# Patient Record
Sex: Male | Born: 1975 | Race: White | Hispanic: No | Marital: Married | Smoking: Never smoker
Health system: Southern US, Community
[De-identification: ages and names within clinical notes are randomized; demographics above are authoritative.]

## PROBLEM LIST (undated history)

## (undated) DIAGNOSIS — N182 Chronic kidney disease, stage 2 (mild): Secondary | ICD-10-CM

## (undated) DIAGNOSIS — R569 Unspecified convulsions: Secondary | ICD-10-CM

## (undated) DIAGNOSIS — K219 Gastro-esophageal reflux disease without esophagitis: Secondary | ICD-10-CM

## (undated) DIAGNOSIS — T7840XA Allergy, unspecified, initial encounter: Secondary | ICD-10-CM

## (undated) DIAGNOSIS — I10 Essential (primary) hypertension: Secondary | ICD-10-CM

## (undated) DIAGNOSIS — G4730 Sleep apnea, unspecified: Secondary | ICD-10-CM

## (undated) HISTORY — DX: Unspecified convulsions: R56.9

## (undated) HISTORY — PX: WISDOM TOOTH EXTRACTION: SHX21

## (undated) HISTORY — DX: Chronic kidney disease, stage 2 (mild): N18.2

## (undated) HISTORY — DX: Allergy, unspecified, initial encounter: T78.40XA

## (undated) HISTORY — DX: Essential (primary) hypertension: I10

## (undated) HISTORY — DX: Sleep apnea, unspecified: G47.30

## (undated) HISTORY — DX: Gastro-esophageal reflux disease without esophagitis: K21.9

## (undated) HISTORY — PX: RHINOPLASTY: SUR1284

---

## 1999-06-10 ENCOUNTER — Emergency Department (HOSPITAL_COMMUNITY): Admission: EM | Admit: 1999-06-10 | Discharge: 1999-06-11 | Payer: Self-pay | Admitting: Internal Medicine

## 1999-06-10 ENCOUNTER — Encounter: Payer: Self-pay | Admitting: Internal Medicine

## 1999-06-20 ENCOUNTER — Ambulatory Visit (HOSPITAL_BASED_OUTPATIENT_CLINIC_OR_DEPARTMENT_OTHER): Admission: RE | Admit: 1999-06-20 | Discharge: 1999-06-20 | Payer: Self-pay | Admitting: *Deleted

## 2018-11-13 ENCOUNTER — Ambulatory Visit: Payer: Self-pay | Admitting: Family

## 2018-11-13 ENCOUNTER — Encounter

## 2018-11-27 ENCOUNTER — Ambulatory Visit: Payer: PRIVATE HEALTH INSURANCE | Admitting: Family

## 2018-11-27 ENCOUNTER — Encounter: Payer: Self-pay | Admitting: Family

## 2018-11-27 ENCOUNTER — Other Ambulatory Visit (INDEPENDENT_AMBULATORY_CARE_PROVIDER_SITE_OTHER): Payer: PRIVATE HEALTH INSURANCE

## 2018-11-27 ENCOUNTER — Encounter (INDEPENDENT_AMBULATORY_CARE_PROVIDER_SITE_OTHER): Payer: Self-pay

## 2018-11-27 VITALS — BP 122/78 | HR 78 | Temp 98.2°F | Ht 76.25 in | Wt 226.0 lb

## 2018-11-27 DIAGNOSIS — R0683 Snoring: Secondary | ICD-10-CM

## 2018-11-27 DIAGNOSIS — J302 Other seasonal allergic rhinitis: Secondary | ICD-10-CM

## 2018-11-27 DIAGNOSIS — Z125 Encounter for screening for malignant neoplasm of prostate: Secondary | ICD-10-CM

## 2018-11-27 DIAGNOSIS — Z0000 Encounter for general adult medical examination without abnormal findings: Secondary | ICD-10-CM

## 2018-11-27 DIAGNOSIS — Z13220 Encounter for screening for lipoid disorders: Secondary | ICD-10-CM | POA: Diagnosis not present

## 2018-11-27 DIAGNOSIS — E559 Vitamin D deficiency, unspecified: Secondary | ICD-10-CM

## 2018-11-27 DIAGNOSIS — J309 Allergic rhinitis, unspecified: Secondary | ICD-10-CM | POA: Insufficient documentation

## 2018-11-27 LAB — CBC WITH DIFFERENTIAL/PLATELET
Basophils Absolute: 0.1 10*3/uL (ref 0.0–0.1)
Basophils Relative: 0.9 % (ref 0.0–3.0)
Eosinophils Absolute: 0.3 10*3/uL (ref 0.0–0.7)
Eosinophils Relative: 3.4 % (ref 0.0–5.0)
HCT: 44.9 % (ref 39.0–52.0)
Hemoglobin: 14.5 g/dL (ref 13.0–17.0)
Lymphocytes Relative: 32.7 % (ref 12.0–46.0)
Lymphs Abs: 2.5 10*3/uL (ref 0.7–4.0)
MCHC: 32.2 g/dL (ref 30.0–36.0)
MCV: 77 fl — ABNORMAL LOW (ref 78.0–100.0)
Monocytes Absolute: 0.8 10*3/uL (ref 0.1–1.0)
Monocytes Relative: 11 % (ref 3.0–12.0)
Neutro Abs: 4 10*3/uL (ref 1.4–7.7)
Neutrophils Relative %: 52 % (ref 43.0–77.0)
Platelets: 202 10*3/uL (ref 150.0–400.0)
RBC: 5.83 Mil/uL — ABNORMAL HIGH (ref 4.22–5.81)
RDW: 18.4 % — ABNORMAL HIGH (ref 11.5–15.5)
WBC: 7.6 10*3/uL (ref 4.0–10.5)

## 2018-11-27 LAB — COMPREHENSIVE METABOLIC PANEL
ALT: 28 U/L (ref 0–53)
AST: 21 U/L (ref 0–37)
Albumin: 4.6 g/dL (ref 3.5–5.2)
Alkaline Phosphatase: 52 U/L (ref 39–117)
BILIRUBIN TOTAL: 0.4 mg/dL (ref 0.2–1.2)
BUN: 12 mg/dL (ref 6–23)
CO2: 26 mEq/L (ref 19–32)
Calcium: 9.6 mg/dL (ref 8.4–10.5)
Chloride: 102 mEq/L (ref 96–112)
Creatinine, Ser: 1.09 mg/dL (ref 0.40–1.50)
GFR: 73.83 mL/min (ref >60.00)
Glucose, Bld: 97 mg/dL (ref 70–99)
Potassium: 4.5 mEq/L (ref 3.5–5.1)
Sodium: 140 mEq/L (ref 135–145)
Total Protein: 7.5 g/dL (ref 6.0–8.3)

## 2018-11-27 LAB — LIPID PANEL
Cholesterol: 242 mg/dL — ABNORMAL HIGH (ref 0–200)
HDL: 47.1 mg/dL (ref >39.00)
LDL Cholesterol: 156 mg/dL — ABNORMAL HIGH (ref 0–99)
NonHDL: 195.08
Total CHOL/HDL Ratio: 5
Triglycerides: 194 mg/dL — ABNORMAL HIGH (ref 0.0–149.0)
VLDL: 38.8 mg/dL (ref 0.0–40.0)

## 2018-11-27 LAB — PSA: PSA: 1.17 ng/mL (ref 0.10–4.00)

## 2018-11-27 LAB — TSH: TSH: 1.7 u[IU]/mL (ref 0.35–4.50)

## 2018-11-27 LAB — VITAMIN D 25 HYDROXY (VIT D DEFICIENCY, FRACTURES): VITD: 27.74 ng/mL — ABNORMAL LOW (ref 30.00–100.00)

## 2018-11-27 NOTE — Patient Instructions (Signed)

## 2018-11-27 NOTE — Progress Notes (Signed)
Mark Price is a 43 y.o. male with the following history as recorded in Epic[employer]:  There are no active problems to display for this patient.   Current Outpatient Medications  Medication Sig Dispense Refill  . loratadine (CLARITIN) 10 MG tablet Take 10 mg by mouth as needed for allergies.     No current facility-administered medications for this visit.     Allergies: Patient has no allergy information on record.  Past Medical History:  Diagnosis Date  . Allergy   . GERD (gastroesophageal reflux disease)   . Seizures ([facility])     Past Surgical History:  Procedure Laterality Date  . RHINOPLASTY      Family History  Problem Relation Age of Onset  . COPD Mother   . Depression Mother   . Stroke Father   . Pulmonary fibrosis Father   . Heart attack Maternal Grandmother     Social History   Tobacco Use  . Smoking status: Never Smoker  . Smokeless tobacco: Never Used  Substance Use Topics  . Alcohol use: Yes    Subjective:  Patient presents today as a new patient; requesting CPE; in baseline state of health today;  Sees dentist regularly; does not exercise regularly; 2 children- 76 yo [place]ton, 3 yo Leonides Sake  Review of Systems  Constitutional: Negative.   HENT: Negative.   Eyes: Negative.   Respiratory: Negative for cough and shortness of breath.   Cardiovascular: Negative for chest pain.  Gastrointestinal: Negative for constipation, diarrhea and heartburn.  Genitourinary: Negative.   Musculoskeletal: Negative for joint pain and neck pain.  Skin: Negative.   Neurological: Positive for seizures. Negative for dizziness and headaches.       Childhood seizures- last took medication at age 28  Psychiatric/Behavioral: Negative for depression. The patient does not have insomnia.      Objective:  Vitals:   11/27/18 1110  BP: 122/78  Pulse: 78  Temp: 98.2 F (36.8 C)  TempSrc: Oral  SpO2: 95%  Weight: 226 lb (102.5 kg)  Height: 6' 4.25" (1.937 m)    General: Well  developed, well nourished, in no acute distress  Skin : Warm and dry.  Head: Normocephalic and atraumatic  Eyes: Sclera and conjunctiva clear; pupils round and reactive to light; extraocular movements intact  Ears: External normal; canals clear; tympanic membranes normal  Oropharynx: Pink, supple. No suspicious lesions  Neck: Supple without thyromegaly, adenopathy  Lungs: Respirations unlabored; clear to auscultation bilaterally without wheeze, rales, rhonchi  CVS exam: normal rate and regular rhythm.  Abdomen: Soft; nontender; nondistended; normoactive bowel sounds; no masses or hepatosplenomegaly  Musculoskeletal: No deformities; no active joint inflammation  Extremities: No edema, cyanosis, clubbing  Vessels: Symmetric bilaterally  Neurologic: Alert and oriented; speech intact; face symmetrical; moves all extremities well; CNII-XII intact without focal deficit    Assessment:  1. PE (physical exam), annual   2. Lipid screening   3. Vitamin D deficiency   4. Loud snoring     Plan:  Age appropriate preventive healthcare needs addressed; encouraged regular eye doctor and dental exams; encouraged regular exercise; will update labs and refills as needed today; follow-up to be determined;    No follow-ups on file.  Orders Placed This Encounter  Procedures  . CBC w/Diff    Standing Status:   Future    Standing Expiration Date:   11/27/2019  . Comp Met (CMET)    Standing Status:   Future    Standing Expiration Date:   11/27/2019  .  Lipid panel    Standing Status:   Future    Standing Expiration Date:   11/27/2019  . TSH    Standing Status:   Future    Standing Expiration Date:   11/27/2019  . Vitamin D (25 hydroxy)    Standing Status:   Future    Standing Expiration Date:   11/27/2019  . Ambulatory referral to Neurology    Referral Priority:   Routine    Referral Type:   Consultation    Referral Reason:   Specialty Services Required    Requested Specialty:   Neurology    Number of  Visits Requested:   1    Requested Prescriptions    No prescriptions requested or ordered in this encounter

## 2018-12-29 ENCOUNTER — Telehealth: Payer: Self-pay | Admitting: Neurology

## 2018-12-29 NOTE — Telephone Encounter (Signed)
I called pt to discuss his appt and update his chart. No answer, left a message asking him to call me back. 

## 2018-12-29 NOTE — Telephone Encounter (Signed)
Due to current COVID 19 pandemic, our office is severely reducing in office visits for at least the next 2 weeks, in order to minimize the risk to our patients and healthcare providers. Our staff will contact you for next steps. I left a message for the pt to call me back for me to go over what a virtual visit is and see if the pt is capable of doing a virtual visit . Once pt calls back if he wishes to do a telephone visit I will go over the consent form with the pt.  °

## 2018-12-29 NOTE — Telephone Encounter (Signed)
Due to current COVID 19 pandemic, our office is severely reducing in office visits for at least the next 2 weeks, in order to minimize the risk to our patients and healthcare providers. Our staff will contact you for next steps.  Pt understands that although there may be some limitations with this type of visit, we will take all precautions to reduce any security or privacy concerns.  Pt understands that this will be treated like an in office visit and we will file with pt's insurance, and there may be a patient responsible charge related to this service. Pt's email is [email or web address]. Pt understands that the [employer] webex software must be downloaded and operational on the device pt plans to use for the visit. Pt understands that the nurse will be calling to go over pt's chart.

## 2018-12-31 NOTE — Telephone Encounter (Signed)
I called pt to discuss his appt and update his chart. No answer, left a message asking him to call me back. 

## 2018-12-31 NOTE — Telephone Encounter (Addendum)
Pt returned my call. Pt's meds, allergies, and PMH were updated.  Pt has never had a sleep study but does endorse snoring.  Pt's recent weight is 225 lb and he is 6'4.  Pt was instructed on how to measure his neck size prior to his appt.  Pt reports that he has downloaded the [employer] and will be ready for his appt.  Epworth Sleepiness Scale 0= would never doze 1= slight chance of dozing 2= moderate chance of dozing 3= high chance of dozing  Sitting and reading: 2 Watching TV: 1 Sitting inactive in a public place (ex. Theater or meeting): 0 As a passenger in a car for an hour without a break: 2 Lying down to rest in the afternoon: 2 Sitting and talking to someone: 0 Sitting quietly after lunch (no alcohol): 1 In a car, while stopped in traffic: 1 Total: 9  FSS: 25

## 2019-01-05 ENCOUNTER — Other Ambulatory Visit: Payer: Self-pay

## 2019-01-05 ENCOUNTER — Ambulatory Visit (INDEPENDENT_AMBULATORY_CARE_PROVIDER_SITE_OTHER): Payer: PRIVATE HEALTH INSURANCE | Admitting: Neurology

## 2019-01-05 ENCOUNTER — Encounter: Payer: Self-pay | Admitting: Neurology

## 2019-01-05 DIAGNOSIS — R0681 Apnea, not elsewhere classified: Secondary | ICD-10-CM | POA: Diagnosis not present

## 2019-01-05 DIAGNOSIS — R0683 Snoring: Secondary | ICD-10-CM | POA: Diagnosis not present

## 2019-01-05 DIAGNOSIS — Z820 Family history of epilepsy and other diseases of the nervous system: Secondary | ICD-10-CM | POA: Diagnosis not present

## 2019-01-05 DIAGNOSIS — E663 Overweight: Secondary | ICD-10-CM

## 2019-01-05 DIAGNOSIS — G4719 Other hypersomnia: Secondary | ICD-10-CM

## 2019-01-05 NOTE — Patient Instructions (Signed)
Given verbally, during today's virtual video-based encounter, with verbal feedback received.   

## 2019-01-05 NOTE — Progress Notes (Signed)
[name], MD, PhD [name] [address], [address] [address] [place], [place] [id number]   Virtual Visit via Video Note on 01/05/2019:  I connected with [name] on 01/05/19 at  3:30 PM EDT by a video enabled telemedicine application and verified that I am speaking with the correct person using two identifiers.   I discussed the limitations of evaluation and management by telemedicine and the availability of in person appointments. The patient expressed understanding and agreed to proceed.  History of Present Illness: Mr. Mark GranaRyan Price is a 43 year old right-handed gentleman with an underlying medical history of reflux disease, allergic rhinitis, history of childhood seizures and mildly overweight state, with whom I am conducting a virtual, video based visit via Webex today, in lieu of a face-to-face visit, for evaluation of his sleep disorder, in particular, concern for underlying obstructive sleep apnea. The patient is unaccompanied today and joins via phone from home. He is referred by his primary care nurse practitioner, Ria ClockLaura Murray and I reviewed her note from 11/27/2018.  His Epworth sleepiness score is 9 out of 24, fatigue severity score is 25 out of 63. He reports snoring even when he weighed less. He suspects that his father has sleep apnea and his older brother has been diagnosed with OSA. Patient has gained weight in the realm of 10 pounds in the past 2 years. He is somewhat tired during the day, not always frankly sleepy. Bedtime is between 10:30 and 11 and rise time between 7 and 8 currently. It does not taken longer to fall asleep. He does not drink much in the way of caffeine, typically one diet soda per day, drinks alcohol infrequently, maybe once every 2 weeks or so, is a nonsmoker. He lives with his wife and 2 children, ages 597 and 783. They have no pets in the household. There is a TV in the bedroom but he does not use it at night typically. 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 He denies recurrent morning headaches or night to night nocturia. He has been told that he has pauses in his breathing while asleep. His snoring can be loud.   His Past Medical History Is Significant For: Past Medical History:  Diagnosis Date  . Allergy   . GERD (gastroesophageal reflux disease)   . Seizures ([facility])     His Past Surgical History Is Significant For: Past Surgical History:  Procedure Laterality Date  . RHINOPLASTY      His Family History Is Significant For: Family History  Problem Relation Age of Onset  . COPD Mother   . Depression Mother   . Stroke Father   . Pulmonary fibrosis Father   . Heart attack Maternal Grandmother     His Social History Is Significant For: Social History   Socioeconomic History  . Marital status: Married    Spouse name: Not on file  . Number of children: Not on file  . Years of education: Not on file  . Highest education level: Not on file  Occupational History  . Not on file  Social Needs  . Financial resource strain: Not on file  . Food insecurity:    Worry: Not on file    Inability: Not on file  . Transportation needs:    Medical: Not on file    Non-medical: Not on file  Tobacco Use  . Smoking status: Never Smoker  . Smokeless tobacco: Never Used  Substance and Sexual Activity  .  Alcohol use: Yes  . Drug use: Never  . Sexual activity: Yes  Lifestyle  . Physical activity:    Days per week: Not on file    Minutes per session: Not on file  . Stress: Not on file  Relationships  . Social connections:    Talks on phone: Not on file    Gets together: Not on file    Attends religious service: Not on file    Active member of club or organization: Not on file    Attends meetings of clubs or organizations: Not on file    Relationship status: Not on file  Other Topics Concern  . Not on file  Social History Narrative  . Not on file    His Allergies Are:   Allergies  Allergen Reactions  . Phenobarbital     hallucinations  :    His Current Medications Are:  Outpatient Encounter Medications as of 01/05/2019  Medication Sig  . loratadine (CLARITIN) 10 MG tablet Take 10 mg by mouth as needed for allergies.   No facility-administered encounter medications on file as of 01/05/2019.   :   Review of Systems:  Out of a complete 14 point review of systems, all are reviewed and negative with the exception of these symptoms as listed below:  Observations/Objective: His most recent vital signs from my review are from 11/27/2018: blood pressure 122/78, pulse 78, weight 226 pounds for BMI of 27.33.  His most recent weight by self-report is 221 lb. His neck circumference by self-report is 16 in. He is pleasant, conversant, in no acute distress. Face is symmetric, normal facial animation in eye blink rate is noted. Extraocular movements are preserved. Speech is clear without dysarthria, hypophonia or voice tremor noted. Airway examination reveals a moderately crowded airway secondary to thicker appearing tongue, larger appearing uvula and tonsils of 2-3+, Mallampati class II. He has a moderate overbite. Neck mobility seems grossly intact. Shoulder height is equal. He stands up without problems and walks without problems.  Assessment and Plan: [name] is a 43 year old right-handed gentleman with an underlying medical history of reflux disease, allergic rhinitis, history of childhood seizures and mildly overweight state, with whom I am conducting a virtual, video based new patient visit via Webex in lieu of a face-to-face visit for evaluation of an underlying organic sleep disorder, in particular, concern for obstructive sleep apnea. The patient's medical history and physical exam (albeit limited with current video-based evaluation) are concerning for a diagnosis of obstructive sleep apnea. I discussed with the patient the diagnosis of OSA, its  prognosis and treatment options. I explained in particular the risks and ramifications of untreated moderate to severe OSA, especially with respect to developing cardiovascular disease down the Road, including congestive heart failure, difficult to treat hypertension, cardiac arrhythmias, or stroke. Even type 2 diabetes has, in part, been linked to untreated OSA. Symptoms of untreated OSA may include daytime sleepiness, memory problems, mood irritability and mood disorder such as depression and anxiety, lack of energy, as well as recurrent headaches, especially morning headaches. We talked about the importance of weight control. We talked about the importance of maintaining good sleep hygiene. I recommended the following at this time: home sleep test.  I explained the sleep test procedure to the patient and also outlined possible treatment options of OSA, including the use of a custom-made dental device (which would require a referral to a specialist dentist), upper airway surgical options, (such as UPPP, which would involve a  referral to an ENT). I also explained the CPAP vs. AutoPAP treatment option to the patient, who indicated that he would be willing to try CPAP if the need arises. I answered all his questions today and the patient was in agreement. I plan to see the patient back after the sleep study is completed and encouraged him to call with any interim questions, concerns, problems or updates.   Huston Foley, MD, PhD    Follow Up Instructions: 1. HST, sleep lab staff will reach out to patient to arrange for sending the unit to home address or "drive by pickup" and for tutorial, making sure patient is comfortable with the unit and usage.  2. Consider AutoPap therapy, if home sleep test positive for obstructive sleep apnea, patient agreeable. 3. We talked about alternative treatment options and current limitations.  4. Follow-up after starting AutoPap therapy, follow-up to be scheduled according  to set-up date. 5. Pursue healthy lifestyle, good sleep hygiene, healthy weight. 6. Call for any interim questions or concerns.   I discussed the assessment and treatment plan with the patient. The patient was provided an opportunity to ask questions and all were answered. The patient agreed with the plan and demonstrated an understanding of the instructions.   The patient was advised to call back or seek an in-person evaluation if the symptoms worsen or if the condition fails to improve as anticipated.  I provided 30 minutes of non-face-to-face time during this encounter.   Huston Foley, MD

## 2019-01-18 ENCOUNTER — Other Ambulatory Visit: Payer: Self-pay

## 2019-01-18 ENCOUNTER — Ambulatory Visit (INDEPENDENT_AMBULATORY_CARE_PROVIDER_SITE_OTHER): Payer: PRIVATE HEALTH INSURANCE | Admitting: Neurology

## 2019-01-18 DIAGNOSIS — Z820 Family history of epilepsy and other diseases of the nervous system: Secondary | ICD-10-CM

## 2019-01-18 DIAGNOSIS — R0683 Snoring: Secondary | ICD-10-CM

## 2019-01-18 DIAGNOSIS — R0681 Apnea, not elsewhere classified: Secondary | ICD-10-CM

## 2019-01-18 DIAGNOSIS — G4733 Obstructive sleep apnea (adult) (pediatric): Secondary | ICD-10-CM

## 2019-01-18 DIAGNOSIS — G4719 Other hypersomnia: Secondary | ICD-10-CM

## 2019-01-18 DIAGNOSIS — E663 Overweight: Secondary | ICD-10-CM

## 2019-01-21 ENCOUNTER — Telehealth: Payer: Self-pay

## 2019-01-21 NOTE — Telephone Encounter (Signed)
I called pt. I advised pt that Dr. Frances Furbish reviewed their sleep study results and found that pt has severe osa. Dr. Frances Furbish recommends that pt start an auto pap. I reviewed PAP compliance expectations with the pt. Pt is agreeable to starting an auto-PAP. I advised pt that an order will be sent to a DME, [facility], and [facility] will call the pt within about one week after they file with the pt's insurance. [facility] will show the pt how to use the machine, fit for masks, and troubleshoot the auto-PAP if needed. A follow up appt was made for insurance purposes with Dr. Frances Furbish on 04/12/2019 at 8:30am. Pt verbalized understanding to arrive 15 minutes early and bring their auto-PAP. A letter with all of this information in it will be mailed to the pt as a reminder. I verified with the pt that the address we have on file is correct. Pt verbalized understanding of results. Pt had no questions at this time but was encouraged to call back if questions arise. I have sent the order to [facility] and have received confirmation that they have received the order.

## 2019-01-21 NOTE — Procedures (Signed)
Patient Information     First Name: Mark RossettiRyan Last Name: Dorna Price ID: [id number]  Birth Date: Nov 28, 1975 Age: [age] Gender: Male  Referring Provider: Olive BassMurray, Laura Woodruff, FNP BMI: 27.33   Neck Circ.:  16 Epworth:  9/24 FSS: 25/63  Sleep Study Information    Study Date: Jan 18, 2019 S/H/A Version: 5.1.76.4 / 4.1.1528 / 2276  History: 43 year old man with a history of reflux disease, allergic rhinitis, history of childhood seizures and mildly overweight state, who reports snoring, witnessed apneas, and a family history of OSA.  Summary & Diagnosis:    Severe OSA  Recommendations:     This home sleep test demonstrates severe obstructive sleep apnea with a total AHI of 63.8/hour and O2 nadir of 68%. Treatment with positive airway pressure - in the form of CPAP - is recommended. This will require, ideally, a full night CPAP titration study for proper treatment settings, O2 monitoring and mask fitting. Based on the severity of the sleep disordered breathing, an attended titration study is indicated. However, under the current circumstances (i.e. the COVID-19 pandemic), in order to ensure ongoing good care and for the safety of the patient, he will be advised to proceed with an autoPAP titration/trial at home. A proper titration study with CPAP may be helpful or needed down the road, when considered safe. Please note, that untreated obstructive sleep apnea may carry additional perioperative morbidity. Patients with significant obstructive sleep apnea should receive perioperative PAP therapy and the surgeons and particularly the anesthesiologist should be informed of the diagnosis and the severity of the sleep disordered breathing. Patient will be reminded regarding compliance with the PAP machine and to be mindful of cleanliness with the equipment and timely with supply changes (i.e. changing filter, mask, hose, humidifier chamber on an ongoing basis as recommended, and cleaning parts that touch the face and  nose daily, etc). The patient should be cautioned not to drive, work at heights, or operate dangerous or heavy equipment when tired or sleepy. Review and reiteration of good sleep hygiene measures should be pursued with any patient. Other causes of the patient's symptoms, including circadian rhythm disturbances, an underlying mood disorder, medication effect and/or an underlying medical problem cannot be ruled out based on this test. Clinical correlation is recommended. The patient and his referring provider will be notified of the test results. The patient [facility], either for a face-to-face or virtual visit, whichever feasible and recommended at the time.  I certify that I have reviewed the raw data recording prior to the issuance of this report in accordance with the standards of the American Academy of Sleep Medicine (AASM).  Huston FoleySaima Kasiah Manka, MD, PhD [name] Diplomat, ABPN (Neurology and Sleep)          Sleep Summary  Oxygen Saturation Statistics   Start Study Time: End Study Time: Total Recording Time:  10:59:41 PM   8:34:37 AM   9 h,  34 min  Total Sleep Time % REM of Sleep Time:  8 h, 56 min  13.4    Mean:  91                       Minimum:  68              Maximum: 99  Mean of Desaturations Nadirs (%): 85     Oxygen Desaturation. %: 4-9 10-20 >20 Total  Events Number Total  131 25.6  346 67.7  34 6.7  511 100.0  Oxygen Saturation:  <90 <=88 <85 <80 <70  Duration (minutes): Sleep % 157.8 132.5 29.4 24.7 52.5 9.8 11.9 2.2 0.2 0.0     Respiratory Indices      Total Events REM NREM All Night  pRDI:  554  pAHI:  553 ODI:  511  pAHIc: 259  % CSR: 0.0 56.2 56.2 56.2 22.2 65.1 65.0 59.4 31.1 63.9 63.8 58.9 29.9       Pulse Rate Statistics during Sleep (BPM)      Mean:  64 Minimum: N/A  Maximum: 118    Indices are calculated using technically valid sleep time of  8 hrs, 40  min. pRDI/pAHI are calculated using oxi desaturations ? 3%  Body Position Statistics  Position Supine Prone Right Left Non-Supine  Sleep (min) 266.0 0.0 134.4 135.0 269.4  Sleep % 49.6 0.0 25.1 25.2 50.2  pRDI 70.1 N/A 56.6 59.1 57.8  pAHI 69.8 N/A 56.6 59.1 57.8  ODI 58.2 N/A 57.5 61.9 59.6     Snoring Statistics Snoring Level (dB) >40 >50 >60 >70 >80 >Threshold (45)  Sleep (min) 525.0 67.1 17.6 0.0 0.0 109.2  Sleep % 97.9 12.5 3.3 0.0 0.0 20.4    Mean: 44 dB Sleep Stages Chart                                                                             pAHI=63.8                                                                                               Mild              Moderate                    Severe                                                    5              15                    30

## 2019-01-21 NOTE — Progress Notes (Signed)
Patient referred by primary care, seen virtually by me on 01/05/19, HST on 01/18/19.    Please call and notify the patient that the recent home sleep test showed obstructive sleep apnea in the severe range. While I recommend treatment for this in the form CPAP, we are not yet bringing patients in for in-lab testing for CPAP titration studies, due to the virus pandemic; therefore, I suggest we start him on a trial of autoPAP at home, which means, that we don't have to bring him in for a sleep study with CPAP, but will let him start using an autoPAP machine at home, through a DME company (of patient's choice, or as per insurance requirement, as per in [employer], if there are such restrictions, depending on insurance carrier). The DME representative will educate the patient on how to use the machine, how to put the mask on, etc. I have placed an order in the chart. Please send referral, talk to patient, send report to referring MD. We will need a FU in sleep clinic for 10 weeks post-PAP set up, please arrange that with me or one of our NPs.  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 For concerns and questions about how to clean the PAP machine and the supplies and how frequently to change the hose, mask and filters, etc., patient can call the DME company, for more information, education and troubleshooting. Especially in the current situation, I recommend, patients be extra mindful about hand hygiene, handling the PAP equipment only with clean hands, wipe the mask daily, keep little one and four-legged companions (and any other pets for that matter) away from the machine and mask at all times.    Thanks,   Huston Foley, MD, PhD [name]  [facility])

## 2019-01-21 NOTE — Telephone Encounter (Signed)
-----   Message from Huston Foley, MD sent at 01/21/2019  8:34 AM EDT ----- Patient referred by primary care, seen virtually by me on 01/05/19, HST on 01/18/19.    Please call and notify the patient that the recent home sleep test showed obstructive sleep apnea in the severe range. While I recommend treatment for this in the form CPAP, we are not yet bringing patients in for in-lab testing for CPAP titration studies, due to the virus pandemic; therefore, I suggest we start him on a trial of autoPAP at home, which means, that we don't have to bring him in for a sleep study with CPAP, but will let him start using an autoPAP machine at home, through a DME company (of patient's choice, or as per insurance requirement, as per in [employer], if there are such restrictions, depending on insurance carrier). The DME representative will educate the patient on how to use the machine, how to put the mask on, etc. I have placed an order in the chart. Please send referral, talk to patient, send report to referring MD. We will need a FU in sleep clinic for 10 weeks post-PAP set up, please arrange that with me or one of our NPs.  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 For concerns and questions about how to clean the PAP machine and the supplies and how frequently to change the hose, mask and filters, etc., patient can call the DME company, for more information, education and troubleshooting. Especially in the current situation, I recommend, patients be extra mindful about hand hygiene, handling the PAP equipment only with clean hands, wipe the mask daily, keep little one and four-legged companions (and any other pets for that matter) away from the machine and mask at all  times.     Thanks,   Huston Foley, MD, PhD [name])

## 2019-01-21 NOTE — Addendum Note (Signed)
Addended by: Huston Foley on: 01/21/2019 08:34 AM   Modules accepted: Orders

## 2019-01-25 ENCOUNTER — Institutional Professional Consult (permissible substitution): Payer: Self-pay | Admitting: Neurology

## 2019-04-07 ENCOUNTER — Encounter: Payer: Self-pay | Admitting: Adult Health

## 2019-04-12 ENCOUNTER — Other Ambulatory Visit: Payer: Self-pay

## 2019-04-12 ENCOUNTER — Ambulatory Visit: Payer: PRIVATE HEALTH INSURANCE | Admitting: Neurology

## 2019-04-12 ENCOUNTER — Encounter: Payer: Self-pay | Admitting: Neurology

## 2019-04-12 VITALS — BP 127/85 | HR 76 | Ht 76.0 in | Wt 229.0 lb

## 2019-04-12 DIAGNOSIS — G4733 Obstructive sleep apnea (adult) (pediatric): Secondary | ICD-10-CM

## 2019-04-12 DIAGNOSIS — Z9989 Dependence on other enabling machines and devices: Secondary | ICD-10-CM | POA: Diagnosis not present

## 2019-04-12 DIAGNOSIS — G4734 Idiopathic sleep related nonobstructive alveolar hypoventilation: Secondary | ICD-10-CM

## 2019-04-12 NOTE — Progress Notes (Signed)
Order for ONO on auto pap sent to [facility] via community message. Confirmation received that the order transmitted was successful.

## 2019-04-12 NOTE — Progress Notes (Signed)
Subjective:    Patient ID: Mark Price is a 43 y.o. male.  HPI     Interim history:   Mr. Mark Price is a 43 year old right-handed gentleman with an underlying medical history of reflux disease, allergic rhinitis, history of childhood seizures and mildly overweight state, who presents for follow-up consultation of his obstructive sleep apnea after interim home sleep testing and starting AutoPap therapy.  The patient is unaccompanied today.  I first met him on 01/05/2019 and virtual visit, at which time he reported snoring and daytime somnolence.  He reported a family history of OSA.  He was advised to proceed with a home sleep test.  He had a home sleep test on 01/18/2019 which indicated severe sleep apnea with an AHI of 63.8/h, O2 nadir of 68%.  He was advised to proceed with AutoPap therapy.    Today, 04/12/2019: I reviewed his AutoPap compliance data from 03/09/2019 through 04/07/2019 which is a total of 30 days, during which time he used his machine every night with percent used days greater than 4 hours at 90%, indicating excellent compliance with an average usage of 6 hours and 25 minutes, residual AHI slightly suboptimal at 7.6/h, 95th percentile of pressure at 10.7 cm with a range of 7 cm to 15 cm with EPR, maximum pressure for the past month of 12 cm, leak on the higher side with a 95th percentile at 26.2 L/min.  He reports that it took about 6 weeks for him to feel adjusted to it.  He is using a ResMed nasal cushion interface.  He is a side sleeper.  Lately, in the past 10 days or so he has tightened the headgear a little bit more and notices less leak.  He is quite pleased with how he feels, he feels better rested, less sleepy during the day and does no longer need to take a nap during the day.  He is motivated to continue with treatment    The patient's allergies, current medications, family history, past medical history, past social history, past surgical history and problem list  were reviewed and updated as appropriate.    Previously:   01/05/2019: He is referred by his primary care nurse practitioner, Jodi Mourning and I reviewed her note from 11/27/2018.  His Epworth sleepiness score is 9 out of 24, fatigue severity score is 25 out of 63. He reports snoring even when he weighed less. He suspects that his father has sleep apnea and his older brother has been diagnosed with OSA. Patient has gained weight in the realm of 10 pounds in the past 2 years. He is somewhat tired during the day, not always frankly sleepy. Bedtime is between 10:30 and 11 and rise time between 7 and 8 currently. It does not taken longer to fall asleep. He does not drink much in the way of caffeine, typically one diet soda per day, drinks alcohol infrequently, maybe once every 2 weeks or so, is a nonsmoker. He lives with his wife and 2 children, ages 36 and 55. They have no pets in the household. There is a TV in the bedroom but he does not use it at night typically. 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 He denies recurrent morning headaches or night to night nocturia. He has been told that he has pauses in his breathing while asleep. His snoring can be loud.    His Past Medical History Is Significant For: Past Medical History:  Diagnosis Date  .  Allergy   . GERD (gastroesophageal reflux disease)   . Seizures ([facility])     His Past Surgical History Is Significant For: Past Surgical History:  Procedure Laterality Date  . RHINOPLASTY      His Family History Is Significant For: Family History  Problem Relation Age of Onset  . COPD Mother   . Depression Mother   . Stroke Father   . Pulmonary fibrosis Father   . Heart attack Maternal Grandmother     His Social History Is Significant For: Social History   Socioeconomic History  . Marital status: Married    Spouse name: Not on file  . Number of children: Not on file  . Years of education: Not on file  . Highest education  level: Not on file  Occupational History  . Not on file  Social Needs  . Financial resource strain: Not on file  . Food insecurity    Worry: Not on file    Inability: Not on file  . Transportation needs    Medical: Not on file    Non-medical: Not on file  Tobacco Use  . Smoking status: Never Smoker  . Smokeless tobacco: Never Used  Substance and Sexual Activity  . Alcohol use: Yes  . Drug use: Never  . Sexual activity: Yes  Lifestyle  . Physical activity    Days per week: Not on file    Minutes per session: Not on file  . Stress: Not on file  Relationships  . Social Herbalist on phone: Not on file    Gets together: Not on file    Attends religious service: Not on file    Active member of club or organization: Not on file    Attends meetings of clubs or organizations: Not on file    Relationship status: Not on file  Other Topics Concern  . Not on file  Social History Narrative  . Not on file    His Allergies Are:  Allergies  Allergen Reactions  . Phenobarbital     hallucinations  :   His Current Medications Are:  Outpatient Encounter Medications as of 04/12/2019  Medication Sig  . loratadine (CLARITIN) 10 MG tablet Take 10 mg by mouth as needed for allergies.   No facility-administered encounter medications on file as of 04/12/2019.   :  Review of Systems:  Out of a complete 14 point review of systems, all are reviewed and negative with the exception of these symptoms as listed below: Review of Systems  Neurological:       Pt presents today to discuss his auto pap. Pt reports that he does feel better after using auto pap.  Epworth Sleepiness Scale 0= would never doze 1= slight chance of dozing 2= moderate chance of dozing 3= high chance of dozing  Sitting and reading: 2 Watching TV: 1 Sitting inactive in a public place (ex. Theater or meeting): 0 As a passenger in a car for an hour without a break: 2 Lying down to rest in the afternoon:  2 Sitting and talking to someone: 0 Sitting quietly after lunch (no alcohol): 1 In a car, while stopped in traffic: 0 Total: 8     Objective:  Neurological Exam  Physical Exam Physical Examination:   Vitals:   04/12/19 0825  BP: 127/85  Pulse: 76   General Examination: The patient is a very pleasant 43 y.o. male in no acute distress. He appears well-developed and well-nourished and well groomed.  HEENT: Normocephalic, atraumatic, pupils are equal, round and reactive to light, extraocular tracking is good without limitation to gaze excursion or nystagmus noted. Normal smooth pursuit is noted. Hearing is grossly intact. Face is symmetric with normal facial animation and normal facial sensation. Speech is clear with no dysarthria noted. There is no hypophonia. There is no lip, neck/head, jaw or voice tremor. Neck is supple with full range of passive and active motion. There are no carotid bruits on auscultation. Oropharynx exam reveals: mild mouth dryness, good dental hygiene and moderate airway crowding, due to Small airway entry, thicker soft palate, tonsils are small. Tongue protrudes centrally in palate elevates symmetrically  Chest: Clear to auscultation without wheezing, rhonchi or crackles noted.  Heart: S1+S2+0, regular and normal without murmurs, rubs or gallops noted.   Abdomen: Soft, non-tender and non-distended with normal bowel sounds appreciated on auscultation.  Extremities: There is no pitting edema in the distal lower extremities bilaterally. Pedal pulses are intact.  Skin: Warm and dry without trophic changes noted. There are no varicose veins.  Musculoskeletal: exam reveals no obvious joint deformities, tenderness or joint swelling or erythema.   Neurologically:  Mental status: The patient is awake, alert and oriented in all 4 spheres. His immediate and remote memory, attention, language skills and fund of knowledge are appropriate. There is no evidence of  aphasia, agnosia, apraxia or anomia. Speech is clear with normal prosody and enunciation. Thought process is linear. Mood is normal and affect is normal.  Cranial nerves II - XII are as described above under HEENT exam. In addition: shoulder shrug is normal with equal shoulder height noted. Motor exam: Normal bulk, strength and tone is noted. There is no drift, tremor or rebound. Romberg is negative. Fine motor skills and coordination: grossly intact.  Cerebellar testing: No dysmetria or intention tremor on finger to nose testing. Heel to shin is unremarkable bilaterally. There is no truncal or gait ataxia.  Sensory exam: intact to light touch in the upper and lower extremities.  Gait, station and balance: He stands easily. No veering to one side is noted. No leaning to one side is noted. Posture is age-appropriate and stance is narrow based. Gait shows normal stride length and normal pace. No problems turning are noted. Tandem walk is unremarkable.  Assessment and Plan:   In summary, TYWONE BEMBENEK is a very pleasant 43 y.o.-year old male with an underlying medical history of reflux disease, allergic rhinitis, history of childhood seizures and mildly overweight state, who presents for follow-up consultation of his obstructive sleep apnea, Which was determined to be in the severe range by home sleep testing on 01/18/2019.  He has established treatment with AutoPap therapy at home and is compliant with treatment.  He is highly commended for his treatment adherence.  He indicates good results including better sleep quality, less daytime somnolence.  He is advised to continue with full compliance.  We will also proceed with an overnight pulse oximetry test to reevaluate oxygen saturations while he is on treatment.  His AHI is slightly suboptimal currently but may improve with time, leak has improved lately and this may bring his AHI numbers down as well.  We will call him with his pulse oximetry test results  soon.  He is advised to follow-up routinely in 6 months with a nurse practitioner and hopefully yearly thereafter.  We talked about risks and ramifications of untreated obstructive sleep apnea and the importance of treating sleep apnea and the importance of full compliance  with treatment.  Thankfully, he feels better as well.  I answered all his questions today and he was in agreement with the plan. I spent 20 minutes in total face-to-face time with the patient, more than 50% of which was spent in counseling and coordination of care, reviewing test results, reviewing medication and discussing or reviewing the diagnosis of OSA, its prognosis and treatment options. Pertinent laboratory and imaging test results that were available during this visit with the patient were reviewed by me and considered in my medical decision making (see chart for details).    Star Age, MD, PhD

## 2019-04-12 NOTE — Patient Instructions (Addendum)
Please continue using your autoPAP regularly. While your insurance requires that you use PAP at least 4 hours each night on 70% of the nights, I recommend, that you not skip any nights and use it throughout the night if you can. Getting used to PAP and staying with the treatment long term does take time and patience and discipline. Untreated obstructive sleep apnea when it is moderate to severe can have an adverse impact on cardiovascular health and raise her risk for heart disease, arrhythmias, hypertension, congestive heart failure, stroke and diabetes. Untreated obstructive sleep apnea causes sleep disruption, nonrestorative sleep, and sleep deprivation. This can have an impact on your day to day functioning and cause daytime sleepiness and impairment of cognitive function, memory loss, mood disturbance, and problems focussing. Using PAP regularly can improve these symptoms. As discussed, we will do an overnight oxygen level test, called ONO, and your DME company will call and set this up for one night, while you also use your autoPAP as usual. We will call you with the results. This is to make sure that your oxygen levels stay in the 90s, while you are treated with autoPAP for your OSA. Remember, your oxygen levels dropped into the 70s and as low as 69% during the home sleep test.   Keep up the good work! We will see you back in 6 months for sleep apnea check up, and if you continue to do well on PAP therapy, we can see you once a year thereafter.

## 2019-04-17 ENCOUNTER — Encounter: Payer: Self-pay | Admitting: Neurology

## 2019-04-19 ENCOUNTER — Telehealth: Payer: Self-pay

## 2019-04-19 NOTE — Telephone Encounter (Signed)
I called pt and discussed his ONO on auto pap results and recommendations. Pt verbalized understanding. Pt had no questions at this time but was encouraged to call back if questions arise.

## 2019-04-19 NOTE — Telephone Encounter (Signed)
I reviewed patient's overnight pulse oximetry test from 04/17/2019.  Test was done on room air on AutoPap therapy.  Total duration of test time was 6 hours and 39 minutes, average oxygen saturation 94%, nadir was 89%, time below or at 89% saturation was 23 seconds.  Please advise patient that his oxygen saturations look good while he is on AutoPap therapy.  Please remind him to be fully compliant with his AutoPap machine, no need for any supplemental oxygen thankfully.  Please advise him to follow-up as scheduled.

## 2019-04-19 NOTE — Telephone Encounter (Signed)
Received ONO on auto pap results from [facility]. 

## 2019-10-11 ENCOUNTER — Encounter: Payer: Self-pay | Admitting: Neurology

## 2019-10-13 ENCOUNTER — Encounter: Payer: Self-pay | Admitting: Adult Health

## 2019-10-13 ENCOUNTER — Other Ambulatory Visit: Payer: Self-pay

## 2019-10-13 ENCOUNTER — Ambulatory Visit: Payer: PRIVATE HEALTH INSURANCE | Admitting: Adult Health

## 2019-10-13 VITALS — BP 122/76 | HR 81 | Temp 97.6°F | Ht 76.0 in | Wt 233.8 lb

## 2019-10-13 DIAGNOSIS — Z9989 Dependence on other enabling machines and devices: Secondary | ICD-10-CM | POA: Diagnosis not present

## 2019-10-13 DIAGNOSIS — G4733 Obstructive sleep apnea (adult) (pediatric): Secondary | ICD-10-CM

## 2019-10-13 NOTE — Patient Instructions (Signed)
Continue using CPAP nightly and greater than 4 hours each night °If your symptoms worsen or you develop new symptoms please let us know.  ° °

## 2019-10-13 NOTE — Progress Notes (Addendum)
PATIENT: Mark Price DOB: 1976/02/01  REASON FOR VISIT: follow up HISTORY FROM: patient  HISTORY OF PRESENT ILLNESS: Today 10/13/19:  Mark Price is a 44 year old male with a history of obstructive sleep apnea on CPAP.  His download indicates that he uses machine nightly for compliance of 100%.  He uses machine greater than 4 hours 29 days for compliance of 97%.  On average he uses his machine 7 hours and 6 minutes.  His residual AHI is 1.7 on 7-15 cmH2O with EPR of 3.  His leak in the 95th percentile is 18.8 L/min.  He reports that the CPAP is working well for him.  He denies any new issues.  He returns today for an evaluation.  HISTORY (copied from Dr. Teofilo Pod note)  04/12/2019: I reviewed his AutoPap compliance data from 03/09/2019 through 04/07/2019 which is a total of 30 days, during which time he used his machine every night with percent used days greater than 4 hours at 90%, indicating excellent compliance with an average usage of 6 hours and 25 minutes, residual AHI slightly suboptimal at 7.6/h, 95th percentile of pressure at 10.7 cm with a range of 7 cm to 15 cm with EPR, maximum pressure for the past month of 12 cm, leak on the higher side with a 95th percentile at 26.2 L/min.  He reports that it took about 6 weeks for him to feel adjusted to it.  He is using a ResMed nasal cushion interface.  He is a side sleeper.  Lately, in the past 10 days or so he has tightened the headgear a little bit more and notices less leak.  He is quite pleased with how he feels, he feels better rested, less sleepy during the day and does no longer need to take a nap during the day.  He is motivated to continue with treatment   REVIEW OF SYSTEMS: Out of a complete 14 system review of symptoms, the patient complains only of the following symptoms, and all other reviewed systems are negative.  See HPI  ALLERGIES: Allergies  Allergen Reactions  . Phenobarbital     hallucinations    HOME  MEDICATIONS: Outpatient Medications Prior to Visit  Medication Sig Dispense Refill  . loratadine (CLARITIN) 10 MG tablet Take 10 mg by mouth as needed for allergies.     No facility-administered medications prior to visit.    PAST MEDICAL HISTORY: Past Medical History:  Diagnosis Date  . Allergy   . GERD (gastroesophageal reflux disease)   . Seizures ([facility])     PAST SURGICAL HISTORY: Past Surgical History:  Procedure Laterality Date  . RHINOPLASTY      FAMILY HISTORY: Family History  Problem Relation Age of Onset  . COPD Mother   . Depression Mother   . Stroke Father   . Pulmonary fibrosis Father   . Heart attack Maternal Grandmother     SOCIAL HISTORY: Social History   Socioeconomic History  . Marital status: Married    Spouse name: Not on file  . Number of children: Not on file  . Years of education: Not on file  . Highest education level: Not on file  Occupational History  . Not on file  Tobacco Use  . Smoking status: Never Smoker  . Smokeless tobacco: Never Used  Substance and Sexual Activity  . Alcohol use: Yes  . Drug use: Never  . Sexual activity: Yes  Other Topics Concern  . Not on file  Social History Narrative  .  Not on file   Social Determinants of Health   Financial Resource Strain:   . Difficulty of Paying Living Expenses: Not on file  Food Insecurity:   . Worried About Programme researcher, broadcasting/film/video in the Last Year: Not on file  . Ran Out of Food in the Last Year: Not on file  Transportation Needs:   . Lack of Transportation (Medical): Not on file  . Lack of Transportation (Non-Medical): Not on file  Physical Activity:   . Days of Exercise per Week: Not on file  . Minutes of Exercise per Session: Not on file  Stress:   . Feeling of Stress : Not on file  Social Connections:   . Frequency of Communication with Friends and Family: Not on file  . Frequency of Social Gatherings with Friends and Family: Not on file  . Attends Religious Services:  Not on file  . Active Member of Clubs or Organizations: Not on file  . Attends Banker Meetings: Not on file  . Marital Status: Not on file  Intimate Partner Violence:   . Fear of Current or Ex-Partner: Not on file  . Emotionally Abused: Not on file  . Physically Abused: Not on file  . Sexually Abused: Not on file      PHYSICAL EXAM  Vitals:   10/13/19 0755  BP: 122/76  Pulse: 81  Temp: 97.6 F (36.4 C)  Weight: 233 lb 12.8 oz (106.1 kg)  Height: 6\' 4"  (1.93 m)   Body mass index is 28.46 kg/m.  Generalized: Well developed, in no acute distress  Chest: Lungs clear to auscultation bilaterally  Neurological examination  Mentation: Alert oriented to time, place, history taking. Follows all commands speech and language fluent Cranial nerve II-XII: Extraocular movements were full, visual field were full on confrontational test Head turning and shoulder shrug  were normal and symmetric. Motor: The motor testing reveals 5 over 5 strength of all 4 extremities. Good symmetric motor tone is noted throughout.  Sensory: Sensory testing is intact to soft touch on all 4 extremities. No evidence of extinction is noted.  Gait and station: Gait is normal.   DIAGNOSTIC DATA (LABS, IMAGING, TESTING) - I reviewed patient records, labs, notes, testing and imaging myself where available.  Lab Results  Component Value Date   WBC 7.6 11/27/2018   HGB 14.5 11/27/2018   HCT 44.9 11/27/2018   MCV 77.0 (L) 11/27/2018   PLT 202.0 11/27/2018      Component Value Date/Time   NA 140 11/27/2018 1145   K 4.5 11/27/2018 1145   CL 102 11/27/2018 1145   CO2 26 11/27/2018 1145   GLUCOSE 97 11/27/2018 1145   BUN 12 11/27/2018 1145   CREATININE 1.09 11/27/2018 1145   CALCIUM 9.6 11/27/2018 1145   PROT 7.5 11/27/2018 1145   ALBUMIN 4.6 11/27/2018 1145   AST 21 11/27/2018 1145   ALT 28 11/27/2018 1145   ALKPHOS 52 11/27/2018 1145   BILITOT 0.4 11/27/2018 1145   Lab Results    Component Value Date   CHOL 242 (H) 11/27/2018   HDL 47.10 11/27/2018   LDLCALC 156 (H) 11/27/2018   TRIG 194.0 (H) 11/27/2018   CHOLHDL 5 11/27/2018   No results found for: HGBA1C No results found for: VITAMINB12 Lab Results  Component Value Date   TSH 1.70 11/27/2018      ASSESSMENT AND PLAN 44 y.o. year old male  has a past medical history of Allergy, GERD (gastroesophageal reflux disease), and  Seizures ([place]). here with:  1. Obstructive sleep apnea on CPAP  The patient's CPAP download shows excellent compliance and good treatment of his apnea.  He is encouraged to continue using CPAP nightly and greater than 4 hours each night.  He is advised that if his symptoms worsen or he develops new symptoms he should let us know.  He will follow-up in 1 year sooner if needed  I spent 15 minutes with the patient. 50% of this time was spent reviewing CPAP download   Ward Givens, MSN, NP-C 10/13/2019, 11:14 AM [facility] [address], [place], [place] [address] [phone]  I reviewed the above note and documentation by the Nurse Practitioner and agree with the history, exam, assessment and plan as outlined above. I was available for consultation. Star Age, MD, PhD [name])

## 2020-08-04 ENCOUNTER — Ambulatory Visit (INDEPENDENT_AMBULATORY_CARE_PROVIDER_SITE_OTHER): Payer: 59

## 2020-08-04 ENCOUNTER — Encounter: Payer: Self-pay | Admitting: Family

## 2020-08-04 ENCOUNTER — Ambulatory Visit (INDEPENDENT_AMBULATORY_CARE_PROVIDER_SITE_OTHER): Payer: 59 | Admitting: Family

## 2020-08-04 ENCOUNTER — Other Ambulatory Visit: Payer: Self-pay

## 2020-08-04 VITALS — BP 118/70 | HR 96 | Temp 97.9°F | Ht 76.0 in | Wt 231.0 lb

## 2020-08-04 DIAGNOSIS — Z0000 Encounter for general adult medical examination without abnormal findings: Secondary | ICD-10-CM | POA: Diagnosis not present

## 2020-08-04 DIAGNOSIS — Z13220 Encounter for screening for lipoid disorders: Secondary | ICD-10-CM

## 2020-08-04 DIAGNOSIS — Z23 Encounter for immunization: Secondary | ICD-10-CM

## 2020-08-04 DIAGNOSIS — Z125 Encounter for screening for malignant neoplasm of prostate: Secondary | ICD-10-CM | POA: Diagnosis not present

## 2020-08-04 DIAGNOSIS — M79671 Pain in right foot: Secondary | ICD-10-CM

## 2020-08-04 DIAGNOSIS — E559 Vitamin D deficiency, unspecified: Secondary | ICD-10-CM

## 2020-08-04 DIAGNOSIS — M79672 Pain in left foot: Secondary | ICD-10-CM

## 2020-08-04 DIAGNOSIS — R0602 Shortness of breath: Secondary | ICD-10-CM

## 2020-08-04 LAB — CBC WITH DIFFERENTIAL/PLATELET
Basophils Absolute: 0.1 10*3/uL (ref 0.0–0.1)
Basophils Relative: 1.2 % (ref 0.0–3.0)
Eosinophils Absolute: 0.2 10*3/uL (ref 0.0–0.7)
Eosinophils Relative: 3.1 % (ref 0.0–5.0)
HCT: 50.1 % (ref 39.0–52.0)
Hemoglobin: 16.1 g/dL (ref 13.0–17.0)
Lymphocytes Relative: 30.2 % (ref 12.0–46.0)
Lymphs Abs: 2.3 10*3/uL (ref 0.7–4.0)
MCHC: 32.2 g/dL (ref 30.0–36.0)
MCV: 81.8 fl (ref 78.0–100.0)
Monocytes Absolute: 0.9 10*3/uL (ref 0.1–1.0)
Monocytes Relative: 11.5 % (ref 3.0–12.0)
Neutro Abs: 4.1 10*3/uL (ref 1.4–7.7)
Neutrophils Relative %: 54 % (ref 43.0–77.0)
Platelets: 177 10*3/uL (ref 150.0–400.0)
RBC: 6.12 Mil/uL — ABNORMAL HIGH (ref 4.22–5.81)
RDW: 19.5 % — ABNORMAL HIGH (ref 11.5–15.5)
WBC: 7.7 10*3/uL (ref 4.0–10.5)

## 2020-08-04 LAB — COMPREHENSIVE METABOLIC PANEL
ALT: 22 U/L (ref 0–53)
AST: 18 U/L (ref 0–37)
Albumin: 4.6 g/dL (ref 3.5–5.2)
Alkaline Phosphatase: 51 U/L (ref 39–117)
BUN: 14 mg/dL (ref 6–23)
CO2: 30 mEq/L (ref 19–32)
Calcium: 9.7 mg/dL (ref 8.4–10.5)
Chloride: 99 mEq/L (ref 96–112)
Creatinine, Ser: 1.18 mg/dL (ref 0.40–1.50)
GFR: 74.95 mL/min (ref >60.00)
Glucose, Bld: 73 mg/dL (ref 70–99)
Potassium: 4.6 mEq/L (ref 3.5–5.1)
Sodium: 137 mEq/L (ref 135–145)
Total Bilirubin: 0.6 mg/dL (ref 0.2–1.2)
Total Protein: 7.7 g/dL (ref 6.0–8.3)

## 2020-08-04 LAB — LIPID PANEL
Cholesterol: 224 mg/dL — ABNORMAL HIGH (ref 0–200)
HDL: 36.8 mg/dL — ABNORMAL LOW (ref >39.00)
NonHDL: 186.94
Total CHOL/HDL Ratio: 6
Triglycerides: 279 mg/dL — ABNORMAL HIGH (ref 0.0–149.0)
VLDL: 55.8 mg/dL — ABNORMAL HIGH (ref 0.0–40.0)

## 2020-08-04 LAB — LDL CHOLESTEROL, DIRECT: Direct LDL: 163 mg/dL

## 2020-08-04 LAB — PSA: PSA: 1.12 ng/mL (ref 0.10–4.00)

## 2020-08-04 LAB — VITAMIN D 25 HYDROXY (VIT D DEFICIENCY, FRACTURES): VITD: 32.04 ng/mL (ref 30.00–100.00)

## 2020-08-04 MED ORDER — PRAMOXINE HCL (PERIANAL) 1 % EX FOAM: 1.0000 "application " | Freq: Three times a day (TID) | CUTANEOUS | 0 refills | Status: DC | PRN

## 2020-08-04 NOTE — Progress Notes (Signed)
Mark Price is a 44 y.o. male with the following history as recorded in EpicCare:  Patient Active Problem List   Diagnosis Date Noted  . Allergic rhinitis 11/27/2018    Current Outpatient Medications  Medication Sig Dispense Refill  . loratadine (CLARITIN) 10 MG tablet Take 10 mg by mouth as needed for allergies.    . pramoxine (PROCTOFOAM) 1 % foam Place 1 application rectally 3 (three) times daily as needed for anal itching. 15 g 0   No current facility-administered medications for this visit.    Allergies: Phenobarbital  Past Medical History:  Diagnosis Date  . Allergy   . GERD (gastroesophageal reflux disease)   . Seizures ([place])     Past Surgical History:  Procedure Laterality Date  . RHINOPLASTY      Family History  Problem Relation Age of Onset  . COPD Mother   . Depression Mother   . Stroke Father   . Pulmonary fibrosis Father   . Heart attack Maternal Grandmother     Social History   Tobacco Use  . Smoking status: Never Smoker  . Smokeless tobacco: Never Used  Substance Use Topics  . Alcohol use: Yes    Subjective:  Presents for yearly CPE; has been concerned about elevated blood pressure at recent visits with dentist and giving blood; is working on losing weight and healthier diet;  Up to date on dental and vision exams; Concerned that he has hemmorhoids as well;  Review of Systems  Constitutional: Negative.   HENT: Negative.   Eyes: Negative.   Respiratory: Positive for shortness of breath.        Occasional/ not exertional   Cardiovascular: Negative.   Gastrointestinal: Negative for constipation.  Genitourinary: Negative.   Musculoskeletal: Negative.   Skin: Negative.   Neurological: Negative.   Endo/Heme/Allergies: Negative.   Psychiatric/Behavioral: Negative.      Objective:  Vitals:   08/04/20 1054  BP: 118/70  Pulse: 96  Temp: 97.9 F (36.6 C)  TempSrc: Oral  SpO2: 98%  Weight: 231 lb (104.8 kg)  Height: '6\' 4"'  (1.93 m)     General: Well developed, well nourished, in no acute distress  Skin : Warm and dry.  Head: Normocephalic and atraumatic  Eyes: Sclera and conjunctiva clear; pupils round and reactive to light; extraocular movements intact  Ears: External normal; canals clear; tympanic membranes normal  Oropharynx: Pink, supple. No suspicious lesions  Neck: Supple without thyromegaly, adenopathy  Lungs: Respirations unlabored; clear to auscultation bilaterally without wheeze, rales, rhonchi  CVS exam: normal rate and regular rhythm.  Abdomen: Soft; nontender; nondistended; normoactive bowel sounds; no masses or hepatosplenomegaly  Musculoskeletal: No deformities; no active joint inflammation  Extremities: No edema, cyanosis, clubbing  Vessels: Symmetric bilaterally  Neurologic: Alert and oriented; speech intact; face symmetrical; moves all extremities well; CNII-XII intact without focal deficit  Patient defers rectal exam;  Assessment:  1. PE (physical exam), annual   2. Lipid screening   3. Prostate cancer screening   4. Vitamin D deficiency   5. Pain in both feet   6. Needs flu shot   7. SOB (shortness of breath)     Plan:  Age appropriate preventive healthcare needs addressed; encouraged regular eye doctor and dental exams; encouraged regular exercise and weight loss; DASH diet discussed;  will update labs and refills as needed today; plan to follow up in 4-6 months, sooner if blood pressure consistently above 140/90. EKG shows NSR; update CXR;  Flu shot given;  Trial of Proctofoam- may need to refer to GI; Refer to podiatrist;  This visit occurred during the SARS-CoV-2 public health emergency.  Safety protocols were in place, including screening questions prior to the visit, additional usage of staff PPE, and extensive cleaning of exam room while observing appropriate contact time as indicated for disinfecting solutions.     No follow-ups on file.  Orders Placed This Encounter  Procedures   . DG Chest 2 View    Standing Status:   Future    Number of Occurrences:   1    Standing Expiration Date:   08/04/2021    Order Specific Question:   Reason for Exam (SYMPTOM  OR DIAGNOSIS REQUIRED)    Answer:   shortness of breath    Order Specific Question:   Preferred imaging location?    Answer:   [facility]  . Flu Vaccine QUAD 36+ mos IM  . CBC with Differential/Platelet    Standing Status:   Future    Number of Occurrences:   1    Standing Expiration Date:   08/04/2021  . Comp Met (CMET)    Standing Status:   Future    Number of Occurrences:   1    Standing Expiration Date:   08/04/2021  . Lipid panel    Standing Status:   Future    Number of Occurrences:   1    Standing Expiration Date:   08/04/2021  . PSA    Standing Status:   Future    Number of Occurrences:   1    Standing Expiration Date:   08/04/2021  . Vitamin D (25 hydroxy)    Standing Status:   Future    Number of Occurrences:   1    Standing Expiration Date:   08/04/2021  . Ambulatory referral to Podiatry    Referral Priority:   Routine    Referral Type:   Consultation    Referral Reason:   Specialty Services Required    Requested Specialty:   Podiatry    Number of Visits Requested:   1  . EKG 12-Lead    Requested Prescriptions   Signed Prescriptions Disp Refills  . pramoxine (PROCTOFOAM) 1 % foam 15 g 0    Sig: Place 1 application rectally 3 (three) times daily as needed for anal itching.

## 2020-08-04 NOTE — Patient Instructions (Addendum)
Please work on losing at least 5-10 pounds in the next 3-6 months; please start checking your blood pressure regularly- you can purchase home cuff (OMRON) cuff; if your numbers are consistently above 140/90, please follow-up to discuss medications. Let's plan to have you follow-up in about 4-6 months to re-check;     DASH Eating Plan DASH stands for "Dietary Approaches to Stop Hypertension." The DASH eating plan is a healthy eating plan that has been shown to reduce high blood pressure (hypertension). It may also reduce your risk for type 2 diabetes, heart disease, and stroke. The DASH eating plan may also help with weight loss. What are tips for following this plan?  General guidelines  Avoid eating more than 2,300 mg (milligrams) of salt (sodium) a day. If you have hypertension, you may need to reduce your sodium intake to 1,500 mg a day.  Limit alcohol intake to no more than 1 drink a day for nonpregnant women and 2 drinks a day for men. One drink equals 12 oz of beer, 5 oz of wine, or 1 oz of hard liquor.  Work with your health care provider to maintain a healthy body weight or to lose weight. Ask what an ideal weight is for you.  Get at least 30 minutes of exercise that causes your heart to beat faster (aerobic exercise) most days of the week. Activities may include walking, swimming, or biking.  Work with your health care provider or diet and nutrition specialist (dietitian) to adjust your eating plan to your individual calorie needs. Reading food labels   Check food labels for the amount of sodium per serving. Choose foods with less than 5 percent of the Daily Value of sodium. Generally, foods with less than 300 mg of sodium per serving fit into this eating plan.  To find whole grains, look for the word "whole" as the first word in the ingredient list. Shopping  Buy products labeled as "low-sodium" or "no salt added."  Buy fresh foods. Avoid canned foods and premade or frozen  meals. Cooking  Avoid adding salt when cooking. Use salt-free seasonings or herbs instead of table salt or sea salt. Check with your health care provider or pharmacist before using salt substitutes.  Do not fry foods. Cook foods using healthy methods such as baking, boiling, grilling, and broiling instead.  Cook with heart-healthy oils, such as olive, canola, soybean, or sunflower oil. Meal planning  Eat a balanced diet that includes: ? 5 or more servings of fruits and vegetables each day. At each meal, try to fill half of your plate with fruits and vegetables. ? Up to 6-8 servings of whole grains each day. ? Less than 6 oz of lean meat, poultry, or fish each day. A 3-oz serving of meat is about the same size as a deck of cards. One egg equals 1 oz. ? 2 servings of low-fat dairy each day. ? A serving of nuts, seeds, or beans 5 times each week. ? Heart-healthy fats. Healthy fats called Omega-3 fatty acids are found in foods such as flaxseeds and coldwater fish, like sardines, salmon, and mackerel.  Limit how much you eat of the following: ? Canned or prepackaged foods. ? Food that is high in trans fat, such as fried foods. ? Food that is high in saturated fat, such as fatty meat. ? Sweets, desserts, sugary drinks, and other foods with added sugar. ? Full-fat dairy products.  Do not salt foods before eating.  Try to eat at least  2 vegetarian meals each week.  Eat more home-cooked food and less restaurant, buffet, and fast food.  When eating at a restaurant, ask that your food be prepared with less salt or no salt, if possible. What foods are recommended? The items listed may not be a complete list. Talk with your dietitian about what dietary choices are best for you. Grains Whole-grain or whole-wheat bread. Whole-grain or whole-wheat pasta. Brown rice. [place]. Bulgur. Whole-grain and low-sodium cereals. Pita bread. Low-fat, low-sodium crackers. Whole-wheat flour  tortillas. Vegetables Fresh or frozen vegetables (raw, steamed, roasted, or grilled). Low-sodium or reduced-sodium tomato and vegetable juice. Low-sodium or reduced-sodium tomato sauce and tomato paste. Low-sodium or reduced-sodium canned vegetables. Fruits All fresh, dried, or frozen fruit. Canned fruit in natural juice (without added sugar). Meat and other protein foods Skinless chicken or [place]. Ground chicken or [place]. Pork with fat trimmed off. Fish and seafood. Egg whites. Dried beans, peas, or lentils. Unsalted nuts, nut butters, and seeds. Unsalted canned beans. Lean cuts of beef with fat trimmed off. Low-sodium, lean deli meat. Dairy Low-fat (1%) or fat-free (skim) milk. Fat-free, low-fat, or reduced-fat cheeses. Nonfat, low-sodium ricotta or cottage cheese. Low-fat or nonfat yogurt. Low-fat, low-sodium cheese. Fats and oils Soft margarine without trans fats. Vegetable oil. Low-fat, reduced-fat, or light mayonnaise and salad dressings (reduced-sodium). Canola, safflower, olive, soybean, and sunflower oils. Avocado. Seasoning and other foods Herbs. Spices. Seasoning mixes without salt. Unsalted popcorn and pretzels. Fat-free sweets. What foods are not recommended? The items listed may not be a complete list. Talk with your dietitian about what dietary choices are best for you. Grains Baked goods made with fat, such as croissants, muffins, or some breads. Dry pasta or rice meal packs. Vegetables Creamed or fried vegetables. Vegetables in a cheese sauce. Regular canned vegetables (not low-sodium or reduced-sodium). Regular canned tomato sauce and paste (not low-sodium or reduced-sodium). Regular tomato and vegetable juice (not low-sodium or reduced-sodium). Angie Fava. Olives. Fruits Canned fruit in a light or heavy syrup. Fried fruit. Fruit in cream or butter sauce. Meat and other protein foods Fatty cuts of meat. Ribs. Fried meat. Berniece Salines. Sausage. Bologna and other processed lunch meats.  Salami. Fatback. Hotdogs. Bratwurst. Salted nuts and seeds. Canned beans with added salt. Canned or smoked fish. Whole eggs or egg yolks. Chicken or [place] with skin. Dairy Whole or 2% milk, cream, and half-and-half. Whole or full-fat cream cheese. Whole-fat or sweetened yogurt. Full-fat cheese. Nondairy creamers. Whipped toppings. Processed cheese and cheese spreads. Fats and oils Butter. Stick margarine. Lard. Shortening. Ghee. Bacon fat. Tropical oils, such as coconut, palm kernel, or palm oil. Seasoning and other foods Salted popcorn and pretzels. Onion salt, garlic salt, seasoned salt, table salt, and sea salt. Worcestershire sauce. Tartar sauce. Barbecue sauce. Teriyaki sauce. Soy sauce, including reduced-sodium. Steak sauce. Canned and packaged gravies. Fish sauce. Oyster sauce. Cocktail sauce. Horseradish that you find on the shelf. Ketchup. Mustard. Meat flavorings and tenderizers. Bouillon cubes. Hot sauce and Tabasco sauce. Premade or packaged marinades. Premade or packaged taco seasonings. Relishes. Regular salad dressings. Where to find more information:  National Heart, Lung, and [place]: https://wilson-eaton.com/  American Heart Association: www.heart.org Summary  The DASH eating plan is a healthy eating plan that has been shown to reduce high blood pressure (hypertension). It may also reduce your risk for type 2 diabetes, heart disease, and stroke.  With the DASH eating plan, you should limit salt (sodium) intake to 2,300 mg a day. If you have hypertension, you may  need to reduce your sodium intake to 1,500 mg a day.  When on the DASH eating plan, aim to eat more fresh fruits and vegetables, whole grains, lean proteins, low-fat dairy, and heart-healthy fats.  Work with your health care provider or diet and nutrition specialist (dietitian) to adjust your eating plan to your individual calorie needs. This information is not intended to replace advice given to you by your health  care provider. Make sure you discuss any questions you have with your health care provider. Document Revised: 08/22/2017 Document Reviewed: 09/02/2016 Elsevier Patient Education  2020 [employer].

## 2020-08-14 ENCOUNTER — Encounter: Payer: Self-pay | Admitting: Podiatry

## 2020-08-14 ENCOUNTER — Ambulatory Visit (INDEPENDENT_AMBULATORY_CARE_PROVIDER_SITE_OTHER): Payer: 59 | Admitting: Podiatry

## 2020-08-14 ENCOUNTER — Ambulatory Visit (INDEPENDENT_AMBULATORY_CARE_PROVIDER_SITE_OTHER): Payer: 59

## 2020-08-14 ENCOUNTER — Other Ambulatory Visit: Payer: Self-pay

## 2020-08-14 DIAGNOSIS — R52 Pain, unspecified: Secondary | ICD-10-CM

## 2020-08-14 DIAGNOSIS — M722 Plantar fascial fibromatosis: Secondary | ICD-10-CM | POA: Diagnosis not present

## 2020-08-14 MED ORDER — TRIAMCINOLONE ACETONIDE 10 MG/ML IJ SUSP
10.0000 mg | Freq: Once | INTRAMUSCULAR | Status: AC
  Administered 2020-08-14: 10 mg

## 2020-08-14 MED ORDER — TRIAMCINOLONE ACETONIDE 10 MG/ML IJ SUSP: 10.0000 mg | Freq: Once | INTRAMUSCULAR | Status: DC

## 2020-08-14 MED ORDER — DICLOFENAC SODIUM 75 MG PO TBEC: 75.0000 mg | DELAYED_RELEASE_TABLET | Freq: Two times a day (BID) | ORAL | 2 refills | Status: DC

## 2020-08-14 NOTE — Patient Instructions (Signed)

## 2020-08-15 NOTE — Progress Notes (Signed)
Subjective:   Patient ID: Mark Price, male   DOB: 44 y.o.   MRN: [id number]   HPI Patient presents stating he has developed a lot of pain in the plantar aspect of both heels.  Does not remember specific injury but has tried to increase his activity and the pain seems to have occurred since he did try to become more active.  Patient does not smoke   Review of Systems  All other systems reviewed and are negative.       Objective:  Physical Exam Vitals and nursing note reviewed.  Constitutional:      Appearance: He is well-developed.  Pulmonary:     Effort: Pulmonary effort is normal.  Musculoskeletal:        General: Normal range of motion.  Skin:    General: Skin is warm.  Neurological:     Mental Status: He is alert.   Neurovascular status was found to be intact muscle strength was found to be adequate range of motion adequate.  Patient is found to have exquisite discomfort plantar aspect heel region bilateral with fluid buildup around the medial band and moderate high arch foot structure.  Patient has good digital perfusion well oriented x3     Assessment:  Acute plantar fasciitis bilateral     Plan:  H&P reviewed condition sterile prep done and injected the plantar fascia 3 mg Kenalog 5 mg Xylocaine advised on physical therapy supportive shoes and ice therapy.  Reappoint in the next 2 to 3 weeks  X-rays indicate small spur no indication stress fracture arthritis

## 2020-08-30 ENCOUNTER — Encounter: Payer: Self-pay | Admitting: Podiatry

## 2020-08-30 ENCOUNTER — Other Ambulatory Visit: Payer: Self-pay

## 2020-08-30 ENCOUNTER — Ambulatory Visit (INDEPENDENT_AMBULATORY_CARE_PROVIDER_SITE_OTHER): Payer: 59 | Admitting: Podiatry

## 2020-08-30 DIAGNOSIS — R52 Pain, unspecified: Secondary | ICD-10-CM | POA: Diagnosis not present

## 2020-08-30 DIAGNOSIS — M722 Plantar fascial fibromatosis: Secondary | ICD-10-CM | POA: Diagnosis not present

## 2020-08-30 NOTE — Progress Notes (Signed)
Subjective:   Patient ID: Mark Price, male   DOB: 44 y.o.   MRN: [id number]   HPI Patient presents stating I am doing much better still have mild discomfort but improved from previous   ROS      Objective:  Physical Exam  Neurovascular status intact with patient found to have significant reduction of discomfort plantar heel region bilateral with cavus foot structure and narrow heel in general     Assessment:  Doing better from having had acute plantar fasciitis with still patient having moderate structural deformity     Plan:  H&P reviewed condition and at this point I have recommended shoe gear modifications support not going barefoot stretching exercises and topical med as needed.  Discussed the possibility for orthotics educated him on them and also reviewed his activity levels and what I would recommend as far as what would work best with his type of condition

## 2020-10-06 ENCOUNTER — Other Ambulatory Visit: Payer: Self-pay | Admitting: Podiatry

## 2020-10-06 DIAGNOSIS — M722 Plantar fascial fibromatosis: Secondary | ICD-10-CM

## 2020-10-16 ENCOUNTER — Telehealth: Payer: PRIVATE HEALTH INSURANCE | Admitting: Adult Health

## 2020-10-16 ENCOUNTER — Telehealth (INDEPENDENT_AMBULATORY_CARE_PROVIDER_SITE_OTHER): Payer: 59 | Admitting: Adult Health

## 2020-10-16 DIAGNOSIS — G4733 Obstructive sleep apnea (adult) (pediatric): Secondary | ICD-10-CM | POA: Diagnosis not present

## 2020-10-16 DIAGNOSIS — Z9989 Dependence on other enabling machines and devices: Secondary | ICD-10-CM

## 2020-10-16 NOTE — Progress Notes (Signed)
PATIENT: Mark Price DOB: 09/22/1976  REASON FOR VISIT: follow up HISTORY FROM: patient  Virtual Visit via Video Note  I connected with Mark Price on 10/16/20 at  3:30 PM EST by a video enabled telemedicine application located remotely at [facility] and verified that I am speaking with the correct person using two identifiers who was located at their own home.   I discussed the limitations of evaluation and management by telemedicine and the availability of in person appointments. The patient expressed understanding and agreed to proceed.   PATIENT: Mark Price DOB: 11-17-1975  REASON FOR VISIT: follow up HISTORY FROM: patient  HISTORY OF PRESENT ILLNESS: Today 10/16/20:  Mark Price is a 45 year old male with a history of obstructive sleep apnea on CPAP.  He reports that his CPAP is working well for him.  States that he uses it nightly.  Reports that he wakes up refreshed and well rested.  His download report is below.  Compliance Report:  Compliance Usage 09/16/2020 - 10/15/2020 Usage days 30/30 days (100%) >= 4 hours 29 days (97%) Average usage (days used) 7 hours 48 minutes  AirSense 10 AutoSet Serial number [id number] Mode AutoSet Min Pressure 7 cmH2O Max Pressure 15 cmH2O EPR Fulltime EPR level 3  Therapy Pressure - cmH2O Median: 7.7 95th percentile: 9.1 Maximum: 10.1 Leaks - L/min Median: 0.3 95th percentile: 14.3 Maximum: 28.2 Events per hour AI: 1.1 HI: 0.2 AHI: 1.3 Apnea Index Central: 0.3 Obstructive: 0.7 Unknown: 0.1   HISTORY 10/13/19:  Mark Price is a 45 year old male with a history of obstructive sleep apnea on CPAP.  His download indicates that he uses machine nightly for compliance of 100%.  He uses machine greater than 4 hours 29 days for compliance of 97%.  On average he uses his machine 7 hours and 6 minutes.  His residual AHI is 1.7 on 7-15 cmH2O with EPR of 3.  His leak in the 95th percentile is  18.8 L/min.  He reports that the CPAP is working well for him.  He denies any new issues.  He returns today for an evaluation.   REVIEW OF SYSTEMS: Out of a complete 14 system review of symptoms, the patient complains only of the following symptoms, and all other reviewed systems are negative.    ALLERGIES: Allergies  Allergen Reactions  . Phenobarbital     hallucinations    HOME MEDICATIONS: Outpatient Medications Prior to Visit  Medication Sig Dispense Refill  . diclofenac (VOLTAREN) 75 MG EC tablet Take 1 tablet (75 mg total) by mouth 2 (two) times daily. 50 tablet 2  . loratadine (CLARITIN) 10 MG tablet Take 10 mg by mouth as needed for allergies.    . pramoxine (PROCTOFOAM) 1 % foam Place 1 application rectally 3 (three) times daily as needed for anal itching. 15 g 0   Facility-Administered Medications Prior to Visit  Medication Dose Route Frequency Provider Last Rate Last Admin  . triamcinolone acetonide (KENALOG) 10 MG/ML injection 10 mg  10 mg Other Once Lenn Sink, DPM        PAST MEDICAL HISTORY: Past Medical History:  Diagnosis Date  . Allergy   . GERD (gastroesophageal reflux disease)   . Seizures ([facility])     PAST SURGICAL HISTORY: Past Surgical History:  Procedure Laterality Date  . RHINOPLASTY      FAMILY HISTORY: Family History  Problem Relation Age of Onset  . COPD Mother   . Depression Mother   .  Stroke Father   . Pulmonary fibrosis Father   . Heart attack Maternal Grandmother     SOCIAL HISTORY: Social History   Socioeconomic History  . Marital status: Married    Spouse name: Not on file  . Number of children: Not on file  . Years of education: Not on file  . Highest education level: Not on file  Occupational History  . Not on file  Tobacco Use  . Smoking status: Never Smoker  . Smokeless tobacco: Never Used  Substance and Sexual Activity  . Alcohol use: Yes  . Drug use: Never  . Sexual activity: Yes  Other Topics Concern  .  Not on file  Social History Narrative  . Not on file   Social Determinants of Health   Financial Resource Strain: Not on file  Food Insecurity: Not on file  Transportation Needs: Not on file  Physical Activity: Not on file  Stress: Not on file  Social Connections: Not on file  Intimate Partner Violence: Not on file      PHYSICAL EXAM Generalized: Well developed, in no acute distress   Neurological examination  Mentation: Alert oriented to time, place, history taking. Follows all commands speech and language fluent Cranial nerve II-XII:Extraocular movements were full. Facial symmetry noted. uvula tongue midline. Head turning and shoulder shrug  were normal and symmetric. Motor: Good strength throughout subjectively per patient Sensory: Sensory testing is intact to soft touch on all 4 extremities subjectively per patient Coordination: Cerebellar testing reveals good finger-nose-finger  Gait and station: Patient is able to stand from a seated position. gait is normal.  Reflexes: UTA  DIAGNOSTIC DATA (LABS, IMAGING, TESTING) - I reviewed patient records, labs, notes, testing and imaging myself where available.  Lab Results  Component Value Date   WBC 7.7 08/04/2020   HGB 16.1 08/04/2020   HCT 50.1 08/04/2020   MCV 81.8 08/04/2020   PLT 177.0 08/04/2020      Component Value Date/Time   NA 137 08/04/2020 1202   K 4.6 08/04/2020 1202   CL 99 08/04/2020 1202   CO2 30 08/04/2020 1202   GLUCOSE 73 08/04/2020 1202   BUN 14 08/04/2020 1202   CREATININE 1.18 08/04/2020 1202   CALCIUM 9.7 08/04/2020 1202   PROT 7.7 08/04/2020 1202   ALBUMIN 4.6 08/04/2020 1202   AST 18 08/04/2020 1202   ALT 22 08/04/2020 1202   ALKPHOS 51 08/04/2020 1202   BILITOT 0.6 08/04/2020 1202   Lab Results  Component Value Date   CHOL 224 (H) 08/04/2020   HDL 36.80 (L) 08/04/2020   LDLCALC 156 (H) 11/27/2018   LDLDIRECT 163.0 08/04/2020   TRIG 279.0 (H) 08/04/2020   CHOLHDL 6 08/04/2020    No results found for: HGBA1C No results found for: VITAMINB12 Lab Results  Component Value Date   TSH 1.70 11/27/2018      ASSESSMENT AND PLAN 45 y.o. year old male  has a past medical history of Allergy, GERD (gastroesophageal reflux disease), and Seizures ([facility]). here with:  OSA on CPAP  . CPAP compliance excellent . Residual AHI is good . Encouraged patient to continue using CPAP nightly and > 4 hours each night . F/U in 1 year or sooner if needed  I spent 20 minutes of face-to-face and non-face-to-face time with patient.  This included previsit chart review, lab review, study review, order entry, electronic health record documentation, patient education.  Butch Penny, MSN, NP-C 10/16/2020, 3:42 PM [name] [address], [address]  [place], [place] [address] [phone]

## 2021-02-02 ENCOUNTER — Ambulatory Visit: Payer: 59 | Admitting: Family

## 2021-04-27 IMAGING — DX DG CHEST 2V
2 series · 2 of 2 positions shown · non-contrast
Comparison: None.

CLINICAL DATA: Shortness of breath

EXAM:
CHEST - 2 VIEW

[chest pa]
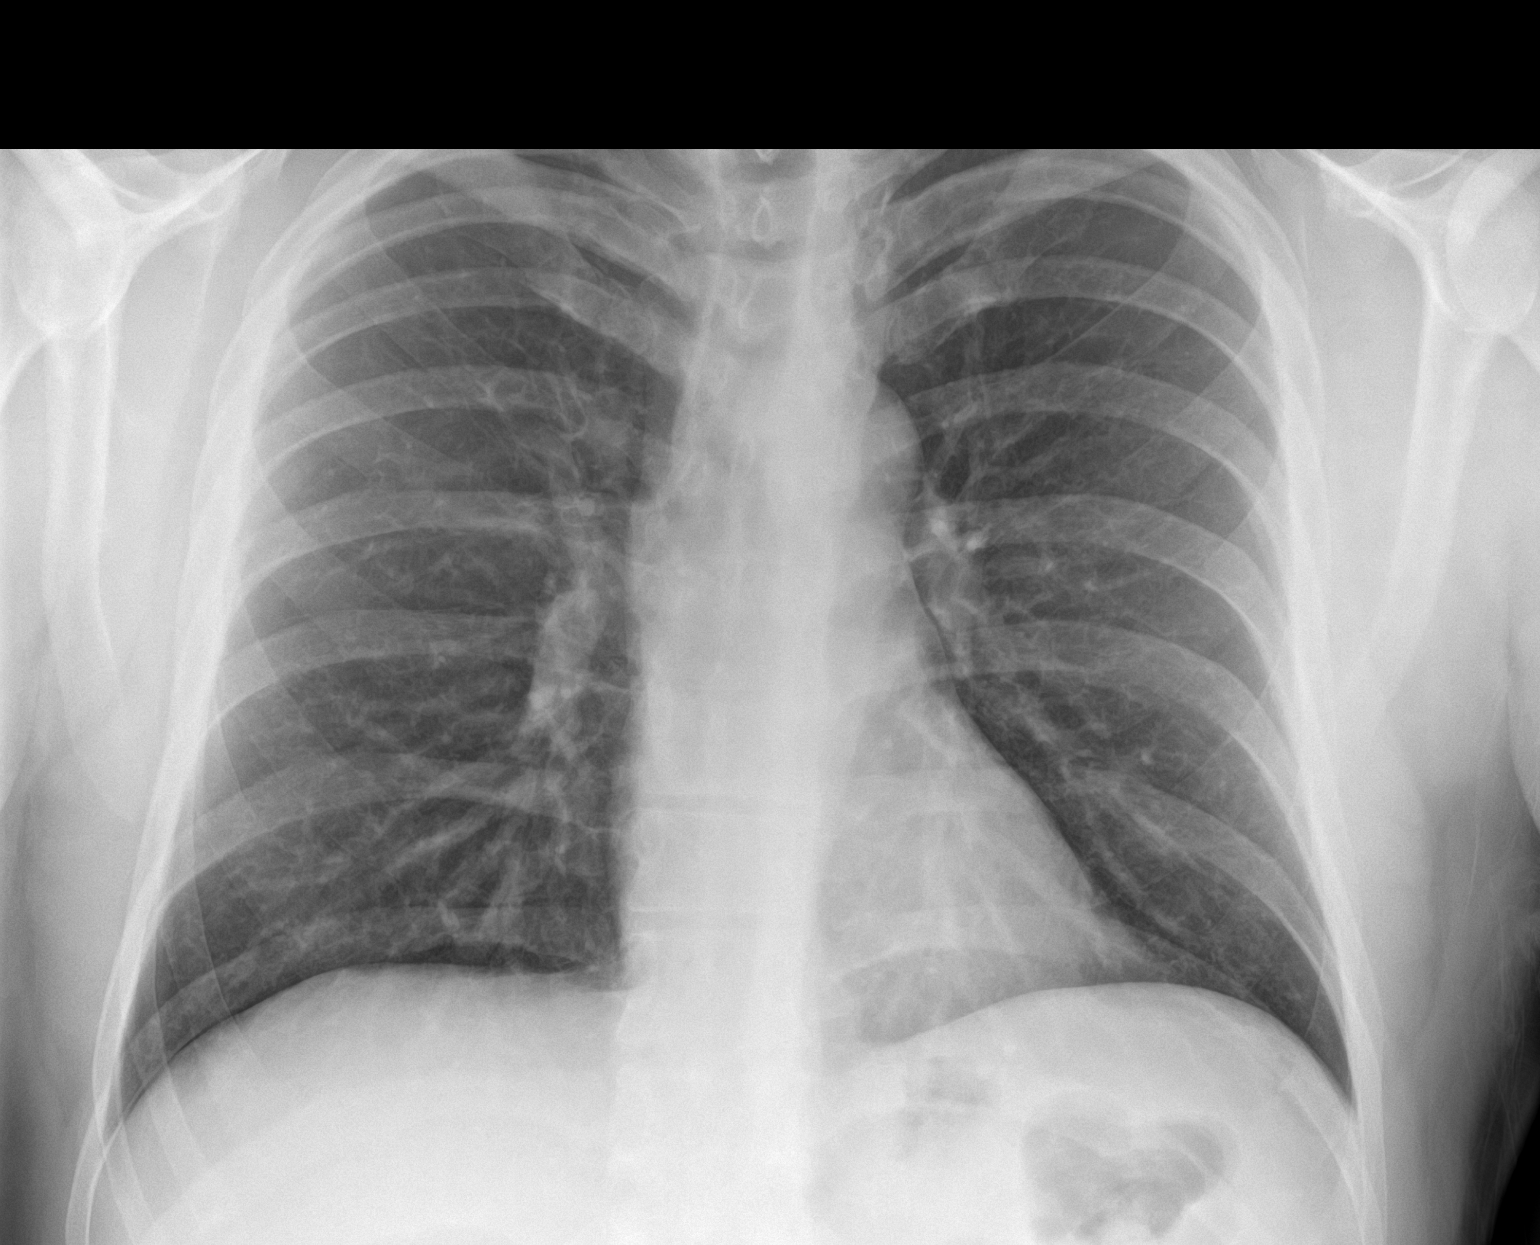

[chest lat]
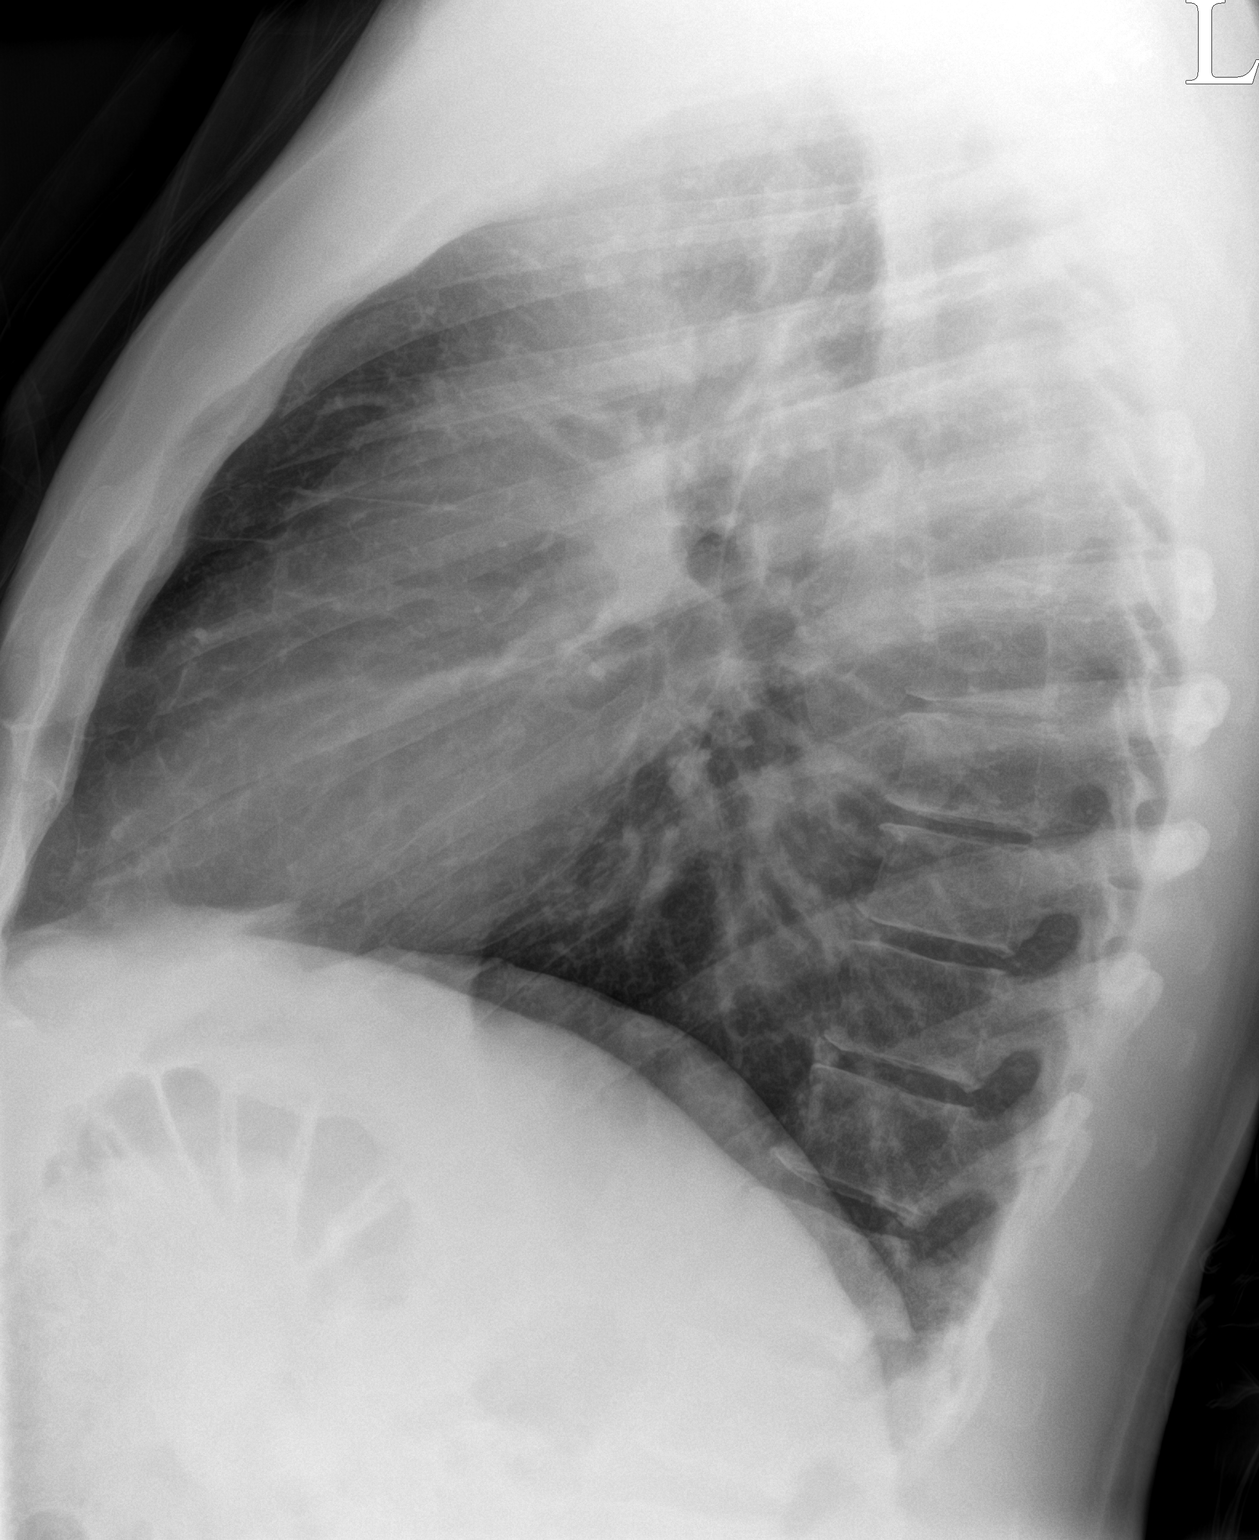

[2 of 2 positions shown; findings below may reference images not displayed]

FINDINGS: Lungs are clear. Heart size and pulmonary vascularity are normal. No
adenopathy. No bone lesions.
IMPRESSION: Lungs clear.  Cardiac silhouette normal.

## 2022-11-01 ENCOUNTER — Encounter: Payer: Self-pay | Admitting: Family Medicine

## 2022-11-01 ENCOUNTER — Ambulatory Visit (INDEPENDENT_AMBULATORY_CARE_PROVIDER_SITE_OTHER): Payer: 59 | Admitting: Family Medicine

## 2022-11-01 VITALS — BP 114/82 | HR 75 | Temp 97.6°F | Ht 76.0 in | Wt 227.0 lb

## 2022-11-01 DIAGNOSIS — E785 Hyperlipidemia, unspecified: Secondary | ICD-10-CM | POA: Diagnosis not present

## 2022-11-01 DIAGNOSIS — G4733 Obstructive sleep apnea (adult) (pediatric): Secondary | ICD-10-CM | POA: Diagnosis not present

## 2022-11-01 DIAGNOSIS — Z7689 Persons encountering health services in other specified circumstances: Secondary | ICD-10-CM

## 2022-11-01 DIAGNOSIS — Z1211 Encounter for screening for malignant neoplasm of colon: Secondary | ICD-10-CM

## 2022-11-01 NOTE — Progress Notes (Signed)
New Patient Office Visit  Subjective    Patient ID: Mark Price, male    DOB: 06-26-1976  Age: 47 y.o. MRN: [id number]  CC:  Chief Complaint  Patient presents with   Establish Care    No concerns, needs to schedule physical    HPI Mark Price presents to establish care  OSA- using CPAP. 4 years.Doing well with it. Sleeps better. Restorative sleep.   Episode of chest pain 1 month ago. Felt like acid reflux which he had in the past. He has not had any pain since. States it could have been a dream.   He has been eating healthy and working on weight loss. Lost 6 lbs in the past 2 months.   Hx of HL. Using air fryer. Exercising and low fat diet.   Certain foods cause him extra gas. No abdominal pain or diarrhea. No constipation.   Never had colon cancer screening.   Denies fever, chills, dizziness, chest pain, palpitations, shortness of breath, abdominal pain, N/V/D, urinary symptoms, LE edema.      Outpatient Encounter Medications as of 11/01/2022  Medication Sig   [DISCONTINUED] diclofenac (VOLTAREN) 75 MG EC tablet Take 1 tablet (75 mg total) by mouth 2 (two) times daily. (Patient not taking: Reported on 11/01/2022)   [DISCONTINUED] loratadine (CLARITIN) 10 MG tablet Take 10 mg by mouth as needed for allergies. (Patient not taking: Reported on 11/01/2022)   [DISCONTINUED] pramoxine (PROCTOFOAM) 1 % foam Place 1 application rectally 3 (three) times daily as needed for anal itching.   [DISCONTINUED] triamcinolone acetonide (KENALOG) 10 MG/ML injection 10 mg    No facility-administered encounter medications on file as of 11/01/2022.    Past Medical History:  Diagnosis Date   Allergy    GERD (gastroesophageal reflux disease)    Seizures (HCC)     Past Surgical History:  Procedure Laterality Date   RHINOPLASTY      Family History  Problem Relation Age of Onset   COPD Mother    Depression Mother    Stroke Father    Pulmonary fibrosis Father    Heart attack  Maternal Grandmother     Social History   Socioeconomic History   Marital status: Married    Spouse name: Not on file   Number of children: Not on file   Years of education: Not on file   Highest education level: Not on file  Occupational History   Not on file  Tobacco Use   Smoking status: Never   Smokeless tobacco: Never  Substance and Sexual Activity   Alcohol use: Yes   Drug use: Never   Sexual activity: Yes  Other Topics Concern   Not on file  Social History Narrative   Not on file   Social Determinants of Health   Financial Resource Strain: Not on file  Food Insecurity: Not on file  Transportation Needs: Not on file  Physical Activity: Not on file  Stress: Not on file  Social Connections: Not on file  Intimate Partner Violence: Not on file    ROS      Objective    BP 114/82 (BP Location: Left Arm, Patient Position: Sitting, Cuff Size: Large)   Pulse 75   Temp 97.6 F (36.4 C) (Temporal)   Ht 6' 4"$  (1.93 m)   Wt 227 lb (103 kg)   SpO2 98%   BMI 27.63 kg/m   Physical Exam Constitutional:      General: He is not in acute distress.  Appearance: He is not ill-appearing.  Cardiovascular:     Rate and Rhythm: Normal rate.  Pulmonary:     Effort: Pulmonary effort is normal.  Neurological:     General: No focal deficit present.     Mental Status: He is alert and oriented to person, place, and time.  Psychiatric:        Mood and Affect: Mood normal.        Behavior: Behavior normal.        Thought Content: Thought content normal.         Assessment & Plan:   Problem List Items Addressed This Visit   None Visit Diagnoses     OSA on CPAP    -  Primary   Hyperlipidemia, unspecified hyperlipidemia type       Screen for colon cancer       Relevant Orders   Ambulatory referral to Gastroenterology   Encounter to establish care          He is a pleasant 47 year old male who is new to the practice and here to establish care.  He is doing  well on CPAP for sleep apnea. History of hyperlipidemia and hypertriglyceridemia.  We discussed a healthy, low-fat, low-cholesterol diet and exercise.  He appears to be doing a good job with this.  We will check fasting lipids at his follow-up CPE.  He has never been screened for colon cancer so I placed a referral to [facility] for this.  Return for needs fasting CPE.   Harland Dingwall, NP-C

## 2022-11-01 NOTE — Patient Instructions (Signed)
Thank you for trusting [place] with your health care.  Will see you back for a fasting complete physical exam at your convenience.  High Cholesterol  High cholesterol is a condition in which the blood has high levels of a white, waxy substance similar to fat (cholesterol). The liver makes all the cholesterol that the body needs. The human body needs small amounts of cholesterol to help build cells. A person gets extra or excess cholesterol from the food that he or she eats. The blood carries cholesterol from the liver to the rest of the body. If you have high cholesterol, deposits (plaques) may build up on the walls of your arteries. Arteries are the blood vessels that carry blood away from your heart. These plaques make the arteries narrow and stiff. Cholesterol plaques increase your risk for heart attack and stroke. Work with your health care provider to keep your cholesterol levels in a healthy range. What increases the risk? The following factors may make you more likely to develop this condition: Eating foods that are high in animal fat (saturated fat) or cholesterol. Being overweight. Not getting enough exercise. A family history of high cholesterol (familial hypercholesterolemia). Use of tobacco products. Having diabetes. What are the signs or symptoms? In most cases, high cholesterol does not usually cause any symptoms. In severe cases, very high cholesterol levels can cause: Fatty bumps under the skin (xanthomas). A white or gray ring around the black center (pupil) of the eye. How is this diagnosed? This condition may be diagnosed based on the results of a blood test. If you are older than 47 years of age, your health care provider may check your cholesterol levels every 4-6 years. You may be checked more often if you have high cholesterol or other risk factors for heart disease. The blood test for cholesterol measures: "Bad" cholesterol, or LDL cholesterol. This is the main type of  cholesterol that causes heart disease. The desired level is less than 100 mg/dL (2.59 mmol/L). "Good" cholesterol, or HDL cholesterol. HDL helps protect against heart disease by cleaning the arteries and carrying the LDL to the liver for processing. The desired level for HDL is 60 mg/dL (1.55 mmol/L) or higher. Triglycerides. These are fats that your body can store or burn for energy. The desired level is less than 150 mg/dL (1.69 mmol/L). Total cholesterol. This measures the total amount of cholesterol in your blood and includes LDL, HDL, and triglycerides. The desired level is less than 200 mg/dL (5.17 mmol/L). How is this treated? Treatment for high cholesterol starts with lifestyle changes, such as diet and exercise. Diet changes. You may be asked to eat foods that have more fiber and less saturated fats or added sugar. Lifestyle changes. These may include regular exercise, maintaining a healthy weight, and quitting use of tobacco products. Medicines. These are given when diet and lifestyle changes have not worked. You may be prescribed a statin medicine to help lower your cholesterol levels. Follow these instructions at home: Eating and drinking  Eat a healthy, balanced diet. This diet includes: Daily servings of a variety of fresh, frozen, or canned fruits and vegetables. Daily servings of whole grain foods that are rich in fiber. Foods that are low in saturated fats and trans fats. These include poultry and fish without skin, lean cuts of meat, and low-fat dairy products. A variety of fish, especially oily fish that contain omega-3 fatty acids. Aim to eat fish at least 2 times a week. Avoid foods and drinks that  have added sugar. Use healthy cooking methods, such as roasting, grilling, broiling, baking, poaching, steaming, and stir-frying. Do not fry your food except for stir-frying. If you drink alcohol: Limit how much you have to: 0-1 drink a day for women who are not pregnant. 0-2  drinks a day for men. Know how much alcohol is in a drink. In the [place] one drink equals one 12 oz bottle of beer (355 mL), one 5 oz glass of wine (148 mL), or one 1 oz glass of hard liquor (44 mL). Lifestyle  Get regular exercise. Aim to exercise for a total of 150 minutes a week. Increase your activity level by doing activities such as gardening, walking, and taking the stairs. Do not use any products that contain nicotine or tobacco. These products include cigarettes, chewing tobacco, and vaping devices, such as e-cigarettes. If you need help quitting, ask your health care provider. General instructions Take over-the-counter and prescription medicines only as told by your health care provider. Keep all follow-up visits. This is important. Where to find more information American Heart Association: www.heart.org National Heart, Lung, and Blood Institute: https://wilson-eaton.com/ Contact a health care provider if: You have trouble achieving or maintaining a healthy diet or weight. You are starting an exercise program. You are unable to stop smoking. Get help right away if: You have chest pain. You have trouble breathing. You have discomfort or pain in your jaw, neck, back, shoulder, or arm. You have any symptoms of a stroke. "BE FAST" is an easy way to remember the main warning signs of a stroke: B - Balance. Signs are dizziness, sudden trouble walking, or loss of balance. E - Eyes. Signs are trouble seeing or a sudden change in vision. F - Face. Signs are sudden weakness or numbness of the face, or the face or eyelid drooping on one side. A - Arms. Signs are weakness or numbness in an arm. This happens suddenly and usually on one side of the body. S - Speech. Signs are sudden trouble speaking, slurred speech, or trouble understanding what people say. T - Time. Time to call emergency services. Write down what time symptoms started. You have other signs of a stroke, such as: A sudden, severe  headache with no known cause. Nausea or vomiting. Seizure. These symptoms may represent a serious problem that is an emergency. Do not wait to see if the symptoms will go away. Get medical help right away. Call your local emergency services (911 in the [place] Do not drive yourself to the hospital. Summary Cholesterol plaques increase your risk for heart attack and stroke. Work with your health care provider to keep your cholesterol levels in a healthy range. Eat a healthy, balanced diet, get regular exercise, and maintain a healthy weight. Do not use any products that contain nicotine or tobacco. These products include cigarettes, chewing tobacco, and vaping devices, such as e-cigarettes. Get help right away if you have any symptoms of a stroke. This information is not intended to replace advice given to you by your health care provider. Make sure you discuss any questions you have with your health care provider. Document Revised: 04/12/2022 Document Reviewed: 11/13/2020 Elsevier Patient Education  [place].

## 2022-11-08 ENCOUNTER — Encounter: Payer: 59 | Admitting: Family Medicine

## 2022-11-13 ENCOUNTER — Encounter: Payer: 59 | Admitting: Family Medicine

## 2022-11-27 ENCOUNTER — Ambulatory Visit (INDEPENDENT_AMBULATORY_CARE_PROVIDER_SITE_OTHER): Payer: 59 | Admitting: Family Medicine

## 2022-11-27 ENCOUNTER — Ambulatory Visit (INDEPENDENT_AMBULATORY_CARE_PROVIDER_SITE_OTHER): Payer: 59

## 2022-11-27 ENCOUNTER — Encounter: Payer: Self-pay | Admitting: Family Medicine

## 2022-11-27 VITALS — BP 116/84 | HR 80 | Temp 97.6°F | Ht 76.0 in | Wt 229.0 lb

## 2022-11-27 DIAGNOSIS — Z1159 Encounter for screening for other viral diseases: Secondary | ICD-10-CM | POA: Insufficient documentation

## 2022-11-27 DIAGNOSIS — Z1211 Encounter for screening for malignant neoplasm of colon: Secondary | ICD-10-CM | POA: Insufficient documentation

## 2022-11-27 DIAGNOSIS — E785 Hyperlipidemia, unspecified: Secondary | ICD-10-CM | POA: Insufficient documentation

## 2022-11-27 DIAGNOSIS — R9431 Abnormal electrocardiogram [ECG] [EKG]: Secondary | ICD-10-CM | POA: Insufficient documentation

## 2022-11-27 DIAGNOSIS — R0609 Other forms of dyspnea: Secondary | ICD-10-CM

## 2022-11-27 DIAGNOSIS — R002 Palpitations: Secondary | ICD-10-CM | POA: Diagnosis not present

## 2022-11-27 DIAGNOSIS — Z136 Encounter for screening for cardiovascular disorders: Secondary | ICD-10-CM | POA: Diagnosis not present

## 2022-11-27 DIAGNOSIS — Z0001 Encounter for general adult medical examination with abnormal findings: Secondary | ICD-10-CM | POA: Insufficient documentation

## 2022-11-27 LAB — CBC WITH DIFFERENTIAL/PLATELET
Basophils Absolute: 0 10*3/uL (ref 0.0–0.1)
Basophils Relative: 0.6 % (ref 0.0–3.0)
Eosinophils Absolute: 0.1 10*3/uL (ref 0.0–0.7)
Eosinophils Relative: 2.2 % (ref 0.0–5.0)
HCT: 48.7 % (ref 39.0–52.0)
Hemoglobin: 16.2 g/dL (ref 13.0–17.0)
Lymphocytes Relative: 32.4 % (ref 12.0–46.0)
Lymphs Abs: 2.1 10*3/uL (ref 0.7–4.0)
MCHC: 33.1 g/dL (ref 30.0–36.0)
MCV: 86 fl (ref 78.0–100.0)
Monocytes Absolute: 0.7 10*3/uL (ref 0.1–1.0)
Monocytes Relative: 10.2 % (ref 3.0–12.0)
Neutro Abs: 3.6 10*3/uL (ref 1.4–7.7)
Neutrophils Relative %: 54.6 % (ref 43.0–77.0)
Platelets: 151 10*3/uL (ref 150.0–400.0)
RBC: 5.67 Mil/uL (ref 4.22–5.81)
RDW: 15.9 % — ABNORMAL HIGH (ref 11.5–15.5)
WBC: 6.5 10*3/uL (ref 4.0–10.5)

## 2022-11-27 LAB — LIPID PANEL
Cholesterol: 208 mg/dL — ABNORMAL HIGH (ref 0–200)
HDL: 41.5 mg/dL (ref >39.00)
LDL Cholesterol: 134 mg/dL — ABNORMAL HIGH (ref 0–99)
NonHDL: 166.57
Total CHOL/HDL Ratio: 5
Triglycerides: 164 mg/dL — ABNORMAL HIGH (ref 0.0–149.0)
VLDL: 32.8 mg/dL (ref 0.0–40.0)

## 2022-11-27 LAB — COMPREHENSIVE METABOLIC PANEL
ALT: 17 U/L (ref 0–53)
AST: 17 U/L (ref 0–37)
Albumin: 4.2 g/dL (ref 3.5–5.2)
Alkaline Phosphatase: 47 U/L (ref 39–117)
BUN: 15 mg/dL (ref 6–23)
CO2: 29 mEq/L (ref 19–32)
Calcium: 9.8 mg/dL (ref 8.4–10.5)
Chloride: 100 mEq/L (ref 96–112)
Creatinine, Ser: 1.15 mg/dL (ref 0.40–1.50)
GFR: 76.06 mL/min (ref >60.00)
Glucose, Bld: 95 mg/dL (ref 70–99)
Potassium: 4.1 mEq/L (ref 3.5–5.1)
Sodium: 137 mEq/L (ref 135–145)
Total Bilirubin: 0.5 mg/dL (ref 0.2–1.2)
Total Protein: 7.5 g/dL (ref 6.0–8.3)

## 2022-11-27 LAB — T4, FREE: Free T4: 0.81 ng/dL (ref 0.60–1.60)

## 2022-11-27 LAB — TSH: TSH: 1.22 u[IU]/mL (ref 0.35–5.50)

## 2022-11-27 NOTE — Progress Notes (Signed)
Subjective:    Patient ID: Mark Price, male    DOB: 1976-09-04, 47 y.o.   MRN: [id number]  HPI Chief Complaint  Patient presents with   Annual Exam    fasting   He is here for a complete physical exam. He also has a new concern.   C/o several month hx of DOE and palpitations. Denies family hx of cardiac disease. Father with strokes in his 50s No chest pain, orthopnea. Uses CPAP  States he attempted to donate blood recently and his BP was very high so he couldn't.   Hx of HLD- LDL 163 in 2021   Social history: married Denies smoking and drug use. Social alcohol use Diet: eating fiber, eats red meat, fairly healthy  Exercise: 4 days per week   Immunizations: UTD   Health maintenance:   Colonoscopy: never  Last PSA: N/A Last Dental Exam: UTD Last Eye Exam: UTD  Fam hx of macular degeneration   Wears seatbelt always, uses sunscreen, smoke detectors in home and functioning, does not text while driving, feels safe in home environment.  Reviewed allergies, medications, past medical, surgical, family, and social history.    Review of Systems Review of Systems Constitutional: -fever, -chills, -sweats, -unexpected weight change,-fatigue ENT: -runny nose, -ear pain, -sore throat Cardiology:  -chest pain, +palpitations, -edema Respiratory: -cough, +shortness of breath (DOE), -wheezing Gastroenterology: -abdominal pain, -nausea, -vomiting, -diarrhea, -constipation  Hematology: -bleeding or bruising problems Musculoskeletal: -arthralgias, -myalgias, -joint swelling, -back pain Ophthalmology: -vision changes Urology: -dysuria, -difficulty urinating, -hematuria, -urinary frequency, -urgency Neurology: -headache, -weakness, -tingling, -numbness       Objective:   Physical Exam BP 116/84 (BP Location: Left Arm, Patient Position: Sitting, Cuff Size: Large)   Pulse 80   Temp 97.6 F (36.4 C) (Temporal)   Ht 6\' 4"  (1.93 m)   Wt 229 lb (103.9 kg)   SpO2 97%   BMI  27.87 kg/m   General Appearance:    Alert, cooperative, no distress, appears stated age  Head:    Normocephalic, without obvious abnormality, atraumatic  Eyes:    PERRL, conjunctiva/corneas clear, EOM's intact  Ears:    Normal TM's and external ear canals  Nose:   Nares normal, mucosa normal, no drainage   Throat:   Lips, mucosa, and tongue normal  Neck:   Supple, no lymphadenopathy;  thyroid:  no   enlargement/tenderness/nodules; no JVD  Back:    Spine nontender, no curvature, ROM normal, no CVA     tenderness  Lungs:     Clear to auscultation bilaterally without wheezes, rales or     ronchi; respirations unlabored  Chest Wall:    No tenderness or deformity   Heart:    Regular rate and rhythm, S1 and S2 normal, no murmur, rub   or gallop  Breast Exam:    No chest wall tenderness, masses or gynecomastia  Abdomen:     Soft, non-tender, nondistended, normoactive bowel sounds,    no masses, no hepatosplenomegaly  Genitalia:    deferred     Extremities:   No clubbing, cyanosis or edema  Pulses:   2+ and symmetric all extremities  Skin:   Skin color, texture, turgor normal, no rashes or lesions  Lymph nodes:   Cervical, supraclavicular, and axillary nodes normal  Neurologic:   CNII-XII intact, normal strength, sensation and gait          Psych:   Normal mood, affect, hygiene and grooming.  Assessment & Plan:  Encounter for general adult medical examination with abnormal findings - Plan: CBC w/Diff, Comprehensive metabolic panel, Lipid panel, Lipid panel, Comprehensive metabolic panel, CBC w/Diff -Preventive health care reviewed.  Counseling on healthy lifestyle including diet and exercise.  Recommend regular dental and eye exams.  GI referral for for screening colonoscopy.  Immunizations reviewed.  Discussed safety.  Hyperlipidemia, unspecified hyperlipidemia type - Plan: Lipid panel, CT CARDIAC SCORING (SELF PAY ONLY), Lipid panel -We discussed CT cardiac scoring and he would  like to self-pay for this.  Follow-up pending lipid panel results.  Screen for colon cancer -Referral made to GI at his last visit.  He will call and schedule his appointment.  Need for hepatitis C screening test - Plan: Hepatitis C Antibody, Hepatitis C Antibody -Done per screening guidelines  DOE (dyspnea on exertion) - Plan: CT CARDIAC SCORING (SELF PAY ONLY), EKG 12-Lead, DG Chest 2 View, Ambulatory referral to Cardiology  Palpitations - Plan: EKG 12-Lead, DG Chest 2 View, TSH, T4, free, Ambulatory referral to Cardiology, T4, free, TSH -EKG shows NSR, rate 67.   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

## 2022-11-27 NOTE — Patient Instructions (Addendum)
Please call [facility] to schedule your Colonoscopy  [phone]  Please go downstairs for labs and a chest x-ray before you leave.  You will hear from [facility] to schedule your CT coronary calcium test.  Monitor your blood pressure at home.  Goal BP is 130/80 or lower.  Continue to avoid sodium.   You also hear from [facility] to schedule a visit.  Preventive Care 67-47 Years Old, Male Preventive care refers to lifestyle choices and visits with your health care provider that can promote health and wellness. Preventive care visits are also called wellness exams. What can I expect for my preventive care visit? Counseling During your preventive care visit, your health care provider may ask about your: Medical history, including: Past medical problems. Family medical history. Current health, including: Emotional well-being. Home life and relationship well-being. Sexual activity. Lifestyle, including: Alcohol, nicotine or tobacco, and drug use. Access to firearms. Diet, exercise, and sleep habits. Safety issues such as seatbelt and bike helmet use. Sunscreen use. Work and work Statistician. Physical exam Your health care provider will check your: Height and weight. These may be used to calculate your BMI (body mass index). BMI is a measurement that tells if you are at a healthy weight. Waist circumference. This measures the distance around your waistline. This measurement also tells if you are at a healthy weight and may help predict your risk of certain diseases, such as type 2 diabetes and high blood pressure. Heart rate and blood pressure. Body temperature. Skin for abnormal spots. What immunizations do I need?  Vaccines are usually given at various ages, according to a schedule. Your health care provider will recommend vaccines for you based on your age, medical history, and lifestyle or other factors, such as travel or where you work. What tests do I  need? Screening Your health care provider may recommend screening tests for certain conditions. This may include: Lipid and cholesterol levels. Diabetes screening. This is done by checking your blood sugar (glucose) after you have not eaten for a while (fasting). Hepatitis B test. Hepatitis C test. HIV (human immunodeficiency virus) test. STI (sexually transmitted infection) testing, if you are at risk. Lung cancer screening. Prostate cancer screening. Colorectal cancer screening. Talk with your health care provider about your test results, treatment options, and if necessary, the need for more tests. Follow these instructions at home: Eating and drinking  Eat a diet that includes fresh fruits and vegetables, whole grains, lean protein, and low-fat dairy products. Take vitamin and mineral supplements as recommended by your health care provider. Do not drink alcohol if your health care provider tells you not to drink. If you drink alcohol: Limit how much you have to 0-2 drinks a day. Know how much alcohol is in your drink. In the [place] one drink equals one 12 oz bottle of beer (355 mL), one 5 oz glass of wine (148 mL), or one 1 oz glass of hard liquor (44 mL). Lifestyle Brush your teeth every morning and night with fluoride toothpaste. Floss one time each day. Exercise for at least 30 minutes 5 or more days each week. Do not use any products that contain nicotine or tobacco. These products include cigarettes, chewing tobacco, and vaping devices, such as e-cigarettes. If you need help quitting, ask your health care provider. Do not use drugs. If you are sexually active, practice safe sex. Use a condom or other form of protection to prevent STIs. Take aspirin only as told by  your health care provider. Make sure that you understand how much to take and what form to take. Work with your health care provider to find out whether it is safe and beneficial for you to take aspirin daily. Find  healthy ways to manage stress, such as: Meditation, yoga, or listening to music. Journaling. Talking to a trusted person. Spending time with friends and family. Minimize exposure to UV radiation to reduce your risk of skin cancer. Safety Always wear your seat belt while driving or riding in a vehicle. Do not drive: If you have been drinking alcohol. Do not ride with someone who has been drinking. When you are tired or distracted. While texting. If you have been using any mind-altering substances or drugs. Wear a helmet and other protective equipment during sports activities. If you have firearms in your house, make sure you follow all gun safety procedures. What's next? Go to your health care provider once a year for an annual wellness visit. Ask your health care provider how often you should have your eyes and teeth checked. Stay up to date on all vaccines. This information is not intended to replace advice given to you by your health care provider. Make sure you discuss any questions you have with your health care provider. Document Revised: 03/07/2021 Document Reviewed: 03/07/2021 Elsevier Patient Education  [place].

## 2022-11-27 NOTE — Assessment & Plan Note (Signed)
EKG shows NSR, rate 67, changes in V3 compared to EKG from 2021

## 2022-11-28 LAB — HEPATITIS C ANTIBODY: Hepatitis C Ab: NONREACTIVE

## 2022-12-09 NOTE — Progress Notes (Signed)
Cardiology Office Note:   Date:  12/13/2022  NAME:  Mark Price    MRN: [id number] DOB:  06-01-1976   PCP:  Girtha Rm, NP-C  Cardiologist:  None  Electrophysiologist:  None   Referring MD: Girtha Rm, NP-C   Chief Complaint  Patient presents with   New Patient (Initial Visit)   Shortness of Breath    History of Present Illness:   Mark Price is a 47 y.o. male with a hx of HLD, obesity who is being seen today for the evaluation of SOB at the request of Henson, Vickie L, NP-C.  He reports for the past 3 to 4 weeks ago he had an episode of heart pounding.  He describes several hours of his heart pounding.  It was not racing.  It was pounding.  Symptoms occurred for a week and have stopped.  He had no further episodes of pounding sensation in his chest.  He also reports over the past few months he is a bit more short of breath with activity.  No chest pain or pressure.  Just short of breath with running up stairs.  Regardless he is able to exercise up to 20 to 30 minutes without any significant symptoms.  When he does vigorous activity he does get a little winded.  He has been inactive for quite some time.  Now getting back into activity.  He reports no history of hypertension but has had fluctuations of blood pressure.  BP stable today.  LDL cholesterol 134.  He underwent calcium scoring yesterday.  Calcium score 14 which is 79 percentile.  We discussed starting statin therapy.  He would like to avoid that.  He would like to try diet and exercise.  This is reasonable.  CV examination normal.  EKG is normal.  He reports no strong family history of heart disease.  He does not smoke.  No alcohol or drug use is reported.  He works from home.  He is a Human resources officer.  He is married.  He has 2 children.  T chol 208 HDL 41, LDL 134, TG 164 TSH 1.22  Past Medical History: Past Medical History:  Diagnosis Date   Allergy    GERD (gastroesophageal reflux disease)    Hypertension     slightly high blood pressure consistently   Seizures (HCC)    Sleep apnea    using CPAP since 2019    Past Surgical History: Past Surgical History:  Procedure Laterality Date   RHINOPLASTY     WISDOM TOOTH EXTRACTION      Current Medications: No outpatient medications have been marked as taking for the 12/13/22 encounter (Office Visit) with Geralynn Rile, MD.     Allergies:    Phenobarbital   Social History: Social History   Socioeconomic History   Marital status: Married    Spouse name: Not on file   Number of children: 2   Years of education: Not on file   Highest education level: Not on file  Occupational History   Occupation: Human resources officer - Forensic psychologist  Tobacco Use   Smoking status: Never   Smokeless tobacco: Never  Substance and Sexual Activity   Alcohol use: Yes    Alcohol/week: 1.0 standard drink of alcohol    Types: 1 Cans of beer per week    Comment: occasional drinks   Drug use: Never   Sexual activity: Yes  Other Topics Concern   Not on file  Social History Narrative  Not on file   Social Determinants of Health   Financial Resource Strain: Not on file  Food Insecurity: Not on file  Transportation Needs: Not on file  Physical Activity: Not on file  Stress: Not on file  Social Connections: Not on file     Family History: The patient's family history includes COPD in his mother; Depression in his mother; Heart attack in his maternal grandmother; Pulmonary fibrosis in his father; Stroke in his father.  ROS:   All other ROS reviewed and negative. Pertinent positives noted in the HPI.     EKGs/Labs/Other Studies Reviewed:   The following studies were personally reviewed by me today:  EKG:  EKG is  ordered today.  The ekg ordered today demonstrates normal sinus rhythm heart rate 65, no acute ischemic changes or evidence of infarction, and was personally reviewed by me.   Recent Labs: 11/27/2022: ALT 17; BUN 15; Creatinine, Ser 1.15;  Hemoglobin 16.2; Platelets 151.0; Potassium 4.1; Sodium 137; TSH 1.22   Recent Lipid Panel    Component Value Date/Time   CHOL 208 (H) 11/27/2022 1019   TRIG 164.0 (H) 11/27/2022 1019   HDL 41.50 11/27/2022 1019   CHOLHDL 5 11/27/2022 1019   VLDL 32.8 11/27/2022 1019   LDLCALC 134 (H) 11/27/2022 1019   LDLDIRECT 163.0 08/04/2020 1202    Physical Exam:   VS:  BP 106/80 (BP Location: Left Arm, Patient Position: Sitting, Cuff Size: Normal)   Pulse 65   Ht 6\' 4"  (1.93 m)   Wt 228 lb (103.4 kg)   BMI 27.75 kg/m    Wt Readings from Last 3 Encounters:  12/13/22 228 lb (103.4 kg)  11/27/22 229 lb (103.9 kg)  11/01/22 227 lb (103 kg)    General: Well nourished, well developed, in no acute distress Head: Atraumatic, normal size  Eyes: PEERLA, EOMI  Neck: Supple, no JVD Endocrine: No thryomegaly Cardiac: Normal S1, S2; RRR; no murmurs, rubs, or gallops Lungs: Clear to auscultation bilaterally, no wheezing, rhonchi or rales  Abd: Soft, nontender, no hepatomegaly  Ext: No edema, pulses 2+ Musculoskeletal: No deformities, BUE and BLE strength normal and equal Skin: Warm and dry, no rashes   Neuro: Alert and oriented to person, place, time, and situation, CNII-XII grossly intact, no focal deficits  Psych: Normal mood and affect   ASSESSMENT:   Mark Price is a 47 y.o. male who presents for the following: 1. SOB (shortness of breath) on exertion   2. Palpitations   3. Mixed hyperlipidemia   4. Agatston CAC score, <100     PLAN:   1. SOB (shortness of breath) on exertion -Suspect deconditioning.  Denies any concerning symptoms for angina.  Is able to maintain a high level of activity but can get a bit winded with vigorous activity.  I encouraged him to remain active.  His EKG is normal.  He is CV exam is normal.  We will obtain an echocardiogram just to obtain a baseline assessment.  I suspect this will be normal.  He has no signs of heart failure.  If symptoms persist with a  level of conditioning I would recommend possible stress test versus coronary CTA.  His coronary calcium score is quite low.  He overall is low risk of CAD.  He will see [place] back as needed based on the results of his testing.  2. Palpitations -Reports a pounding sensation in his chest.  No longer having these episodes.  We discussed that a monitor may  be of low yield given no active symptoms.  We discussed the Kardia mobile device that he may want to look into purchasing and this can monitor his heart rhythm at home.  If symptoms persist he can either pursue a cardia mobile or reach back out to [place] for a monitor.  Currently no symptoms.  No indications for monitoring.  Thyroid studies are normal.  Overall exam normal.  EKG normal.  Low suspicion for arrhythmia.  3. Mixed hyperlipidemia 4. Agatston CAC score, <100 -LDL cholesterol 134.  Calcium score 14 which is 79th percentile.  We discussed statin therapy for preventive purposes.  He is not interested.  He would like to do this with diet and lifestyle modification.  I am okay with this.  He does not need aspirin given his low calcium score.  Disposition: Return if symptoms worsen or fail to improve.  Medication Adjustments/Labs and Tests Ordered: Current medicines are reviewed at length with the patient today.  Concerns regarding medicines are outlined above.  Orders Placed This Encounter  Procedures   EKG 12-Lead   ECHOCARDIOGRAM COMPLETE   No orders of the defined types were placed in this encounter.   Patient Instructions  Medication Instructions:  The current medical regimen is effective;  continue present plan and medications.  *If you need a refill on your cardiac medications before your next appointment, please call your pharmacy*   Testing/Procedures: Echocardiogram - Your physician has requested that you have an echocardiogram. Echocardiography is a painless test that uses sound waves to create images of your heart. It provides  your doctor with information about the size and shape of your heart and how well your heart's chambers and valves are working. This procedure takes approximately one hour. There are no restrictions for this procedure.    Follow-Up: At [facility], you and your health needs are our priority.  As part of our continuing mission to provide you with exceptional heart care, we have created designated Provider Care Teams.  These Care Teams include your primary Cardiologist (physician) and Advanced Practice Providers (APPs -  Physician Assistants and Nurse Practitioners) who all work together to provide you with the care you need, when you need it.  We recommend signing up for the patient portal called "MyChart".  Sign up information is provided on this After Visit Summary.  MyChart is used to connect with patients for Virtual Visits (Telemedicine).  Patients are able to view lab/test results, encounter notes, upcoming appointments, etc.  Non-urgent messages can be sent to your provider as well.   To learn more about what you can do with MyChart, go to [email or web address].    Your next appointment:   As needed  Provider:   Eleonore Chiquito, MD       Signed, Addison Naegeli. Audie Box, MD, [facility]  [address] [place], [place] [address] [phone]  12/13/2022 9:12 AM

## 2022-12-12 ENCOUNTER — Ambulatory Visit (HOSPITAL_COMMUNITY)
Admission: RE | Admit: 2022-12-12 | Discharge: 2022-12-12 | Disposition: A | Discharge status: Home / Self Care | Payer: 59 | Source: Ambulatory Visit | Attending: Family Medicine | Admitting: Family Medicine

## 2022-12-12 DIAGNOSIS — Z136 Encounter for screening for cardiovascular disorders: Secondary | ICD-10-CM

## 2022-12-12 DIAGNOSIS — R0609 Other forms of dyspnea: Secondary | ICD-10-CM | POA: Insufficient documentation

## 2022-12-12 DIAGNOSIS — E785 Hyperlipidemia, unspecified: Secondary | ICD-10-CM

## 2022-12-13 ENCOUNTER — Ambulatory Visit: Payer: 59 | Attending: Cardiovascular Disease | Admitting: Cardiovascular Disease

## 2022-12-13 ENCOUNTER — Encounter: Payer: Self-pay | Admitting: Cardiovascular Disease

## 2022-12-13 VITALS — BP 106/80 | HR 65 | Ht 76.0 in | Wt 228.0 lb

## 2022-12-13 DIAGNOSIS — R002 Palpitations: Secondary | ICD-10-CM | POA: Diagnosis not present

## 2022-12-13 DIAGNOSIS — R0602 Shortness of breath: Secondary | ICD-10-CM | POA: Diagnosis not present

## 2022-12-13 DIAGNOSIS — E782 Mixed hyperlipidemia: Secondary | ICD-10-CM | POA: Diagnosis not present

## 2022-12-13 DIAGNOSIS — R931 Abnormal findings on diagnostic imaging of heart and coronary circulation: Secondary | ICD-10-CM | POA: Diagnosis not present

## 2022-12-13 NOTE — Patient Instructions (Addendum)
Medication Instructions:  The current medical regimen is effective;  continue present plan and medications.  *If you need a refill on your cardiac medications before your next appointment, please call your pharmacy*   Testing/Procedures:  Echocardiogram - Your physician has requested that you have an echocardiogram. Echocardiography is a painless test that uses sound waves to create images of your heart. It provides your doctor with information about the size and shape of your heart and how well your heart's chambers and valves are working. This procedure takes approximately one hour. There are no restrictions for this procedure.     Follow-Up: At [facility], you and your health needs are our priority.  As part of our continuing mission to provide you with exceptional heart care, we have created designated Provider Care Teams.  These Care Teams include your primary Cardiologist (physician) and Advanced Practice Providers (APPs -  Physician Assistants and Nurse Practitioners) who all work together to provide you with the care you need, when you need it.  We recommend signing up for the patient portal called "MyChart".  Sign up information is provided on this After Visit Summary.  MyChart is used to connect with patients for Virtual Visits (Telemedicine).  Patients are able to view lab/test results, encounter notes, upcoming appointments, etc.  Non-urgent messages can be sent to your provider as well.   To learn more about what you can do with MyChart, go to [email or web address].    Your next appointment:   As needed  Provider:    O'Neal, MD   

## 2023-01-15 ENCOUNTER — Ambulatory Visit (HOSPITAL_COMMUNITY): Payer: 59 | Attending: Cardiovascular Disease

## 2023-01-15 DIAGNOSIS — R0602 Shortness of breath: Secondary | ICD-10-CM | POA: Insufficient documentation

## 2023-01-15 LAB — ECHOCARDIOGRAM COMPLETE
Area-P 1/2: 3.63 cm2
S' Lateral: 3.1 cm

## 2023-01-30 ENCOUNTER — Encounter: Payer: Self-pay | Admitting: Gastroenterology

## 2023-02-13 ENCOUNTER — Ambulatory Visit (AMBULATORY_SURGERY_CENTER): Payer: 59

## 2023-02-13 VITALS — Ht 76.0 in | Wt 225.0 lb

## 2023-02-13 DIAGNOSIS — Z1211 Encounter for screening for malignant neoplasm of colon: Secondary | ICD-10-CM

## 2023-02-13 MED ORDER — NA SULFATE-K SULFATE-MG SULF 17.5-3.13-1.6 GM/177ML PO SOLN: 1.0000 | Freq: Once | ORAL | 0 refills | Status: AC

## 2023-02-13 NOTE — Progress Notes (Signed)
No egg or soy allergy known to patient  No issues known to pt with past sedation with any surgeries or procedures  Patient denies ever being told they had issues or difficulty with intubation   No FH of Malignant Hyperthermia  Pt is not on diet pills  Pt is not on  home 02   Pt is not on blood thinners   Pt denies issues with constipation   No A fib or A flutter  Have any cardiac testing pending--no  Pt instructed to use Singlecare.com or GoodRx for a price reduction on prep   Pt denies mobility issues  Patient's chart reviewed by Cathlyn Parsons CNRA prior to previsit and patient appropriate for the [facility].  Previsit completed and red dot placed by patient's name on their procedure day (on provider's schedule).

## 2023-02-19 ENCOUNTER — Encounter: Payer: 59 | Admitting: Gastroenterology

## 2023-02-20 ENCOUNTER — Telehealth: Payer: Self-pay | Admitting: Gastroenterology

## 2023-02-20 DIAGNOSIS — Z1211 Encounter for screening for malignant neoplasm of colon: Secondary | ICD-10-CM

## 2023-02-20 MED ORDER — NA SULFATE-K SULFATE-MG SULF 17.5-3.13-1.6 GM/177ML PO SOLN
1.0000 | Freq: Once | ORAL | 0 refills | Status: AC
Start: 2023-02-20 — End: 2023-02-20

## 2023-02-20 NOTE — Telephone Encounter (Signed)
Patient calling states the pharmacy his medication was sent to does not accept his insurance, requesting it to be sent to [facility] [address]. Please advise

## 2023-02-20 NOTE — Telephone Encounter (Signed)
[facility].  Pt made aware

## 2023-02-25 ENCOUNTER — Encounter: Payer: Self-pay | Admitting: Gastroenterology

## 2023-02-28 ENCOUNTER — Ambulatory Visit: Payer: 59 | Admitting: Family Medicine

## 2023-03-04 ENCOUNTER — Encounter: Payer: Self-pay | Admitting: Certified Registered Nurse Anesthetist

## 2023-03-07 ENCOUNTER — Encounter: Payer: Self-pay | Admitting: Gastroenterology

## 2023-03-07 ENCOUNTER — Ambulatory Visit (AMBULATORY_SURGERY_CENTER): Payer: 59 | Admitting: Gastroenterology

## 2023-03-07 VITALS — BP 128/97 | HR 58 | Temp 98.6°F | Resp 14 | Ht 76.0 in | Wt 225.0 lb

## 2023-03-07 DIAGNOSIS — Z1211 Encounter for screening for malignant neoplasm of colon: Secondary | ICD-10-CM

## 2023-03-07 MED ORDER — SODIUM CHLORIDE 0.9 % IV SOLN
500.0000 mL | Freq: Once | INTRAVENOUS | Status: DC
Start: 2023-03-07 — End: 2023-03-07

## 2023-03-07 NOTE — Op Note (Signed)
[facility] Patient Name: Mark Price Procedure Date: 03/07/2023 8:28 AM MRN: [id number] Endoscopist: Sherilyn Cooter L. Myrtie Neither , MD, [id number] Age: 47 Referring MD:  Date of Birth: 1976/01/14 Gender: Male Account #: [id number] Procedure:                Colonoscopy Indications:              Screening for colorectal malignant neoplasm, This                            is the patient's first colonoscopy Medicines:                Monitored Anesthesia Care Procedure:                Pre-Anesthesia Assessment:                           - Prior to the procedure, a History and Physical                            was performed, and patient medications and                            allergies were reviewed. The patient's tolerance of                            previous anesthesia was also reviewed. The risks                            and benefits of the procedure and the sedation                            options and risks were discussed with the patient.                            All questions were answered, and informed consent                            was obtained. Prior Anticoagulants: The patient has                            taken no anticoagulant or antiplatelet agents. ASA                            Grade Assessment: III - A patient with severe                            systemic disease. After reviewing the risks and                            benefits, the patient was deemed in satisfactory                            condition to undergo the procedure.  After obtaining informed consent, the colonoscope                            was passed under direct vision. Throughout the                            procedure, the patient's blood pressure, pulse, and                            oxygen saturations were monitored continuously. The                            CF [id number] #[id number] was introduced through the anus                            and advanced to the  the cecum, identified by                            appendiceal orifice and ileocecal valve. The                            colonoscopy was somewhat difficult due to a                            redundant colon. Successful completion of the                            procedure was aided by using manual pressure and                            straightening and shortening the scope to obtain                            bowel loop reduction. The patient tolerated the                            procedure well. The quality of the bowel                            preparation was good after lavage. The ileocecal                            valve, appendiceal orifice, and rectum were                            photographed. The bowel preparation used was SUPREP                            via split dose instruction. Scope In: 8:31:46 AM Scope Out: 8:50:35 AM Scope Withdrawal Time: 0 hours 13 minutes 50 seconds  Total Procedure Duration: 0 hours 18 minutes 49 seconds  Findings:                 The perianal and digital rectal examinations were  normal.                           Repeat examination of right colon under NBI                            performed.                           Multiple diverticula were found in the left colon.                           The exam was otherwise without abnormality on                            direct and retroflexion views. Complications:            No immediate complications. Estimated Blood Loss:     Estimated blood loss: none. Impression:               - Diverticulosis in the left colon.                           - The examination was otherwise normal on direct                            and retroflexion views.                           - No specimens collected. Recommendation:           - Patient has a contact number available for                            emergencies. The signs and symptoms of potential                             delayed complications were discussed with the                            patient. Return to normal activities tomorrow.                            Written discharge instructions were provided to the                            patient.                           - Resume previous diet.                           - Continue present medications.                           - Repeat colonoscopy in 10 years for screening  purposes.( Consume more water with prep for next                            exam.) Wise Fees L. Myrtie Neither, MD 03/07/2023 8:54:10 AM This report has been signed electronically.

## 2023-03-07 NOTE — Progress Notes (Signed)
Report given to PACU, vss 

## 2023-03-07 NOTE — Progress Notes (Signed)
Pt's states no medical or surgical changes since previsit or office visit. 

## 2023-03-07 NOTE — Progress Notes (Signed)
History and Physical:  This patient presents for endoscopic testing for: Encounter Diagnosis  Name Primary?   Special screening for malignant neoplasms, colon Yes    Average risk for colorectal cancer.  First screening exam.  Patient denies chronic abdominal pain, rectal bleeding, constipation or diarrhea.    Patient is otherwise without complaints or active issues today.   Past Medical History: Past Medical History:  Diagnosis Date   Allergy    pollen   GERD (gastroesophageal reflux disease)    Hypertension    slightly high blood pressure consistently   Seizures (HCC)    childhood seizures, no seizure since 1990   Sleep apnea    using CPAP since 2019     Past Surgical History: Past Surgical History:  Procedure Laterality Date   RHINOPLASTY     WISDOM TOOTH EXTRACTION      Allergies: Allergies  Allergen Reactions   Phenobarbital     hallucinations    Outpatient Meds: No current outpatient medications on file.   Current Facility-Administered Medications  Medication Dose Route Frequency Provider Last Rate Last Admin   0.9 %  sodium chloride infusion  500 mL Intravenous Once Charlie Pitter III, MD          ___________________________________________________________________ Objective   Exam:  BP 112/70   Pulse 70   Temp 98.6 F (37 C) (Temporal)   Ht 6\' 4"  (1.93 m)   Wt 225 lb (102.1 kg)   SpO2 96%   BMI 27.39 kg/m   CV: regular , S1/S2 Resp: clear to auscultation bilaterally, normal RR and effort noted GI: soft, no tenderness, with active bowel sounds.   Assessment: Encounter Diagnosis  Name Primary?   Special screening for malignant neoplasms, colon Yes     Plan: Colonoscopy   The benefits and risks of the planned procedure were described in detail with the patient or (when appropriate) their health care proxy.  Risks were outlined as including, but not limited to, bleeding, infection, perforation, adverse medication reaction leading  to cardiac or pulmonary decompensation, pancreatitis (if ERCP).  The limitation of incomplete mucosal visualization was also discussed.  No guarantees or warranties were given.  The patient is appropriate for an endoscopic procedure in the ambulatory setting.   - Amada Jupiter, MD

## 2023-03-07 NOTE — Patient Instructions (Signed)
Handouts Provided:  Diverticulosis  REPEAT Colonoscopy in 10 years for screening purposes.  YOU HAD AN ENDOSCOPIC PROCEDURE TODAY AT THE [facility]:   Refer to the procedure report that was given to you for any specific questions about what was found during the examination.  If the procedure report does not answer your questions, please call your gastroenterologist to clarify.  If you requested that your care partner not be given the details of your procedure findings, then the procedure report has been included in a sealed envelope for you to review at your convenience later.  YOU SHOULD EXPECT: Some feelings of bloating in the abdomen. Passage of more gas than usual.  Walking can help get rid of the air that was put into your GI tract during the procedure and reduce the bloating. If you had a lower endoscopy (such as a colonoscopy or flexible sigmoidoscopy) you may notice spotting of blood in your stool or on the toilet paper. If you underwent a bowel prep for your procedure, you may not have a normal bowel movement for a few days.  Please Note:  You might notice some irritation and congestion in your nose or some drainage.  This is from the oxygen used during your procedure.  There is no need for concern and it should clear up in a day or so.  SYMPTOMS TO REPORT IMMEDIATELY:  Following lower endoscopy (colonoscopy or flexible sigmoidoscopy):  Excessive amounts of blood in the stool  Significant tenderness or worsening of abdominal pains  Swelling of the abdomen that is new, acute  Fever of 100F or higher  For urgent or emergent issues, a gastroenterologist can be reached at any hour by calling [phone]. Do not use MyChart messaging for urgent concerns.    DIET:  We do recommend a small meal at first, but then you may proceed to your regular diet.  Drink plenty of fluids but you should avoid alcoholic beverages for 24 hours.  ACTIVITY:  You should plan to take it easy  for the rest of today and you should NOT DRIVE or use heavy machinery until tomorrow (because of the sedation medicines used during the test).    FOLLOW UP: Our staff will call the number listed on your records the next business day following your procedure.  We will call around 7:15- 8:00 am to check on you and address any questions or concerns that you may have regarding the information given to you following your procedure. If we do not reach you, we will leave a message.     If any biopsies were taken you will be contacted by phone or by letter within the next 1-3 weeks.  Please call us at [phone] if you have not heard about the biopsies in 3 weeks.    SIGNATURES/CONFIDENTIALITY: You and/or your care partner have signed paperwork which will be entered into your electronic medical record.  These signatures attest to the fact that that the information above on your After Visit Summary has been reviewed and is understood.  Full responsibility of the confidentiality of this discharge information lies with you and/or your care-partner.

## 2023-03-10 ENCOUNTER — Telehealth: Payer: Self-pay

## 2023-03-10 NOTE — Telephone Encounter (Signed)
  Follow up Call-     03/07/2023    7:40 AM  Call back number  Post procedure Call Back phone  # [phone]  Permission to leave phone message Yes     Patient questions:  Do you have a fever, pain , or abdominal swelling? No. Pain Score  0 *  Have you tolerated food without any problems? Yes.    Have you been able to return to your normal activities? Yes.    Do you have any questions about your discharge instructions: Diet   No. Medications  No. Follow up visit  No.  Do you have questions or concerns about your Care? No.  Actions: * If pain score is 4 or above: No action needed, pain <4.

## 2024-01-16 ENCOUNTER — Encounter: Admitting: Family Medicine

## 2024-01-23 ENCOUNTER — Ambulatory Visit (INDEPENDENT_AMBULATORY_CARE_PROVIDER_SITE_OTHER): Admitting: Family Medicine

## 2024-01-23 ENCOUNTER — Encounter: Payer: Self-pay | Admitting: Family Medicine

## 2024-01-23 VITALS — BP 116/78 | HR 68 | Temp 97.9°F | Ht 76.0 in | Wt 229.0 lb

## 2024-01-23 DIAGNOSIS — Z0000 Encounter for general adult medical examination without abnormal findings: Secondary | ICD-10-CM

## 2024-01-23 DIAGNOSIS — G4733 Obstructive sleep apnea (adult) (pediatric): Secondary | ICD-10-CM | POA: Insufficient documentation

## 2024-01-23 DIAGNOSIS — D225 Melanocytic nevi of trunk: Secondary | ICD-10-CM | POA: Diagnosis not present

## 2024-01-23 DIAGNOSIS — Z0001 Encounter for general adult medical examination with abnormal findings: Secondary | ICD-10-CM

## 2024-01-23 DIAGNOSIS — E782 Mixed hyperlipidemia: Secondary | ICD-10-CM

## 2024-01-23 LAB — COMPREHENSIVE METABOLIC PANEL WITH GFR
ALT: 23 U/L (ref 0–53)
AST: 21 U/L (ref 0–37)
Albumin: 4.6 g/dL (ref 3.5–5.2)
Alkaline Phosphatase: 48 U/L (ref 39–117)
BUN: 13 mg/dL (ref 6–23)
CO2: 31 meq/L (ref 19–32)
Calcium: 9.5 mg/dL (ref 8.4–10.5)
Chloride: 99 meq/L (ref 96–112)
Creatinine, Ser: 1.16 mg/dL (ref 0.40–1.50)
GFR: 74.67 mL/min (ref >60.00)
Glucose, Bld: 94 mg/dL (ref 70–99)
Potassium: 4.1 meq/L (ref 3.5–5.1)
Sodium: 137 meq/L (ref 135–145)
Total Bilirubin: 0.7 mg/dL (ref 0.2–1.2)
Total Protein: 7.7 g/dL (ref 6.0–8.3)

## 2024-01-23 LAB — LIPID PANEL
Cholesterol: 238 mg/dL — ABNORMAL HIGH (ref 0–200)
HDL: 41.7 mg/dL (ref >39.00)
LDL Cholesterol: 161 mg/dL — ABNORMAL HIGH (ref 0–99)
NonHDL: 196.53
Total CHOL/HDL Ratio: 6
Triglycerides: 178 mg/dL — ABNORMAL HIGH (ref 0.0–149.0)
VLDL: 35.6 mg/dL (ref 0.0–40.0)

## 2024-01-23 LAB — CBC WITH DIFFERENTIAL/PLATELET
Basophils Absolute: 0.1 10*3/uL (ref 0.0–0.1)
Basophils Relative: 0.9 % (ref 0.0–3.0)
Eosinophils Absolute: 0.1 10*3/uL (ref 0.0–0.7)
Eosinophils Relative: 2.3 % (ref 0.0–5.0)
HCT: 48.5 % (ref 39.0–52.0)
Hemoglobin: 15.9 g/dL (ref 13.0–17.0)
Lymphocytes Relative: 37.5 % (ref 12.0–46.0)
Lymphs Abs: 2.4 10*3/uL (ref 0.7–4.0)
MCHC: 32.7 g/dL (ref 30.0–36.0)
MCV: 90.7 fl (ref 78.0–100.0)
Monocytes Absolute: 0.6 10*3/uL (ref 0.1–1.0)
Monocytes Relative: 9.9 % (ref 3.0–12.0)
Neutro Abs: 3.1 10*3/uL (ref 1.4–7.7)
Neutrophils Relative %: 49.4 % (ref 43.0–77.0)
Platelets: 151 10*3/uL (ref 150.0–400.0)
RBC: 5.35 Mil/uL (ref 4.22–5.81)
RDW: 14.7 % (ref 11.5–15.5)
WBC: 6.4 10*3/uL (ref 4.0–10.5)

## 2024-01-23 NOTE — Progress Notes (Signed)
 Complete physical exam  Patient: Mark Price   DOB: Oct 13, 1975   48 y.o. Male  MRN: [id number]  Subjective:    Chief Complaint  Patient presents with   Annual Exam    fasting   He is here for a complete physical exam.  Other provider: Cardiologist - work up in 2024   He is not on any medications.   Exercises but no recent cardio   OSA- using CPAP nightly. Sleeps well.   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 The date shown is not the original due date.    Wears seatbelt always, uses sunscreen, smoke detectors in home and functioning, does not text while driving, feels safe in home environment.  Depression screening:    01/23/2024    7:57 AM 11/27/2022   10:15 AM 11/01/2022    8:41 AM  Depression screen PHQ 2/9  Decreased Interest 0 0 0  Down, Depressed, Hopeless 0 0 0  PHQ - 2 Score 0 0 0   Anxiety Screening:     No data to display          Vision:Within last year and Dental: No current dental problems and Receives regular dental care  Patient Active Problem List   Diagnosis Date Noted   OSA on CPAP 01/23/2024   Atypical nevus of back 01/23/2024   Encounter for general adult medical examination with abnormal findings 11/27/2022   Hyperlipidemia 11/27/2022   Screen for colon cancer 11/27/2022   Need for hepatitis C screening test 11/27/2022   DOE (dyspnea on exertion) 11/27/2022   Palpitations 11/27/2022   Abnormal EKG 11/27/2022   Allergic rhinitis 11/27/2018   Past Medical History:  Diagnosis Date   Allergy    pollen   GERD (gastroesophageal  reflux disease)    Hypertension    slightly high blood pressure consistently   Seizures (HCC)    childhood seizures, 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   No facility-administered medications prior to visit.    Review of Systems  Constitutional:  Negative for chills, fever, malaise/fatigue and weight loss.  HENT:  Negative for congestion, ear pain, sinus pain and sore throat.   Eyes:  Negative for blurred vision, double vision and pain.  Respiratory:  Negative for cough, shortness of breath and wheezing.  Cardiovascular:  Negative for chest pain, palpitations and leg swelling.  Gastrointestinal:  Negative for abdominal pain, constipation, diarrhea, nausea and vomiting.  Genitourinary:  Negative for dysuria, frequency and urgency.  Musculoskeletal:  Negative for back pain, joint pain and myalgias.  Skin:  Negative for rash.  Neurological:  Negative for dizziness, tingling, focal weakness and headaches.  Endo/Heme/Allergies:  Does not bruise/bleed easily.  Psychiatric/Behavioral:  Negative for depression, memory loss and suicidal ideas. The patient is not nervous/anxious.        Objective:    BP 116/78 (BP Location: Left Arm, Patient Position: Sitting)   Pulse 68   Temp 97.9 F (36.6 C) (Temporal)   Ht 6\' 4"  (1.93 m)   Wt 229 lb (103.9 kg)   SpO2 98%   BMI 27.87 kg/m  BP Readings from Last 3 Encounters:  01/23/24 116/78   03/07/23 (!) 128/97  12/13/22 106/80   Wt Readings from Last 3 Encounters:  01/23/24 229 lb (103.9 kg)  03/07/23 225 lb (102.1 kg)  02/13/23 225 lb (102.1 kg)    Physical Exam Exam conducted with a chaperone present.  Constitutional:      General: He is not in acute distress.    Appearance: He is not ill-appearing.  HENT:     Right Ear: Tympanic membrane, ear canal and external ear normal.     Left Ear: Tympanic membrane, ear canal and external ear normal.     Nose: Nose normal.     Mouth/Throat:     Mouth: Mucous membranes are moist.     Pharynx: Oropharynx is clear.  Eyes:     Extraocular Movements: Extraocular movements intact.     Conjunctiva/sclera: Conjunctivae normal.     Pupils: Pupils are equal, round, and reactive to light.  Neck:     Thyroid: No thyroid mass, thyromegaly or thyroid tenderness.  Cardiovascular:     Rate and Rhythm: Normal rate and regular rhythm.     Pulses: Normal pulses.     Heart sounds: Normal heart sounds.  Pulmonary:     Effort: Pulmonary effort is normal.     Breath sounds: Normal breath sounds.  Abdominal:     General: Bowel sounds are normal. There is no distension.     Palpations: Abdomen is soft.     Tenderness: There is no abdominal tenderness. There is no right CVA tenderness, left CVA tenderness, guarding or rebound.     Hernia: There is no hernia in the left inguinal area or right inguinal area.  Genitourinary:    Penis: Normal.      Testes: Normal.     Epididymis:     Right: Normal.     Left: Normal.  Musculoskeletal:        General: Normal range of motion.     Cervical back: Normal range of motion and neck supple. No tenderness.     Right lower leg: No edema.     Left lower leg: No edema.  Lymphadenopathy:     Cervical: No cervical adenopathy.     Lower Body: No right inguinal adenopathy. No left inguinal adenopathy.  Skin:    General: Skin is warm and dry.     Findings: No lesion or rash.     Comments: Irregularly  pigmented nevus of mid back. Fair skin. Light eyes. Multiple nevi.  Neurological:     General: No focal deficit present.     Mental Status: He is alert and oriented to person, place, and time.  Cranial Nerves: No cranial nerve deficit.     Sensory: No sensory deficit.     Motor: No weakness.  Psychiatric:        Mood and Affect: Mood normal.        Behavior: Behavior normal.        Thought Content: Thought content normal.      No results found for any visits on 01/23/24.    Assessment & Plan:    Routine Health Maintenance and Physical Exam  Problem List Items Addressed This Visit     Atypical nevus of back   Encounter for general adult medical examination with abnormal findings - Primary   Relevant Orders   CBC with Differential/Platelet   Comprehensive metabolic panel with GFR   Hyperlipidemia   Relevant Orders   Lipid panel   OSA on CPAP   Preventive health care reviewed.  Counseling on healthy lifestyle including diet and exercise.  Recommend regular dental and eye exams.  Immunizations reviewed.  Discussed safety. Recommend dermatologist evaluation. He will call to schedule.  Continue good compliance with CPAP.  Follow up pending lab results.   Return in about 1 year (around 01/22/2025).     Alyson Back, NP-C

## 2024-01-23 NOTE — Patient Instructions (Signed)
Dermatology offices  [facility]: [phone] Address: [address], [place], [place] [address] [place], [place] [address]  [facility]: Phone #: [phone] Address: [address], [place], [place] [address]  [facility]: Phone: [phone]  Address: [address], , [place] [address]  [facility] Address: [address], , [place] [address] Phone: [phone] 

## 2024-01-27 ENCOUNTER — Encounter: Payer: Self-pay | Admitting: Family Medicine
